# Patient Record
Sex: Female | Born: 1945
Health system: Southern US, Community
[De-identification: ages and names within clinical notes are randomized; demographics above are authoritative.]

## PROBLEM LIST (undated history)

## (undated) DIAGNOSIS — I10 Essential (primary) hypertension: Secondary | ICD-10-CM

## (undated) DIAGNOSIS — C4491 Basal cell carcinoma of skin, unspecified: Secondary | ICD-10-CM

## (undated) DIAGNOSIS — D039 Melanoma in situ, unspecified: Secondary | ICD-10-CM

## (undated) HISTORY — PX: BLADDER SURGERY: SHX569

## (undated) HISTORY — PX: ABDOMINAL HYSTERECTOMY: SHX81

## (undated) HISTORY — PX: CHOLECYSTECTOMY: SHX55

## (undated) HISTORY — DX: Essential (primary) hypertension: I10

## (undated) HISTORY — PX: TUBAL LIGATION: SHX77

---
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
## 1999-03-26 ENCOUNTER — Other Ambulatory Visit: Admission: RE | Admit: 1999-03-26 | Discharge: 1999-03-26 | Payer: Self-pay | Admitting: Obstetrics and Gynecology

## 2000-04-23 ENCOUNTER — Other Ambulatory Visit: Admission: RE | Admit: 2000-04-23 | Discharge: 2000-04-23 | Payer: Self-pay | Admitting: Obstetrics and Gynecology

## 2001-07-19 ENCOUNTER — Other Ambulatory Visit: Admission: RE | Admit: 2001-07-19 | Discharge: 2001-07-19 | Payer: Self-pay | Admitting: Obstetrics and Gynecology

## 2001-08-11 ENCOUNTER — Encounter: Payer: Self-pay | Admitting: Obstetrics and Gynecology

## 2001-08-11 ENCOUNTER — Encounter: Admission: RE | Admit: 2001-08-11 | Discharge: 2001-08-11 | Payer: Self-pay | Admitting: Obstetrics and Gynecology

## 2002-08-16 ENCOUNTER — Other Ambulatory Visit: Admission: RE | Admit: 2002-08-16 | Discharge: 2002-08-16 | Payer: Self-pay | Admitting: Gynecology

## 2003-12-19 ENCOUNTER — Encounter: Admission: RE | Admit: 2003-12-19 | Discharge: 2003-12-19 | Payer: Self-pay | Admitting: Family Medicine

## 2004-04-21 ENCOUNTER — Encounter (INDEPENDENT_AMBULATORY_CARE_PROVIDER_SITE_OTHER): Payer: Self-pay | Admitting: *Deleted

## 2004-04-21 ENCOUNTER — Ambulatory Visit (HOSPITAL_COMMUNITY): Admission: RE | Admit: 2004-04-21 | Discharge: 2004-04-22 | Payer: Self-pay | Admitting: General Surgery

## 2006-09-27 ENCOUNTER — Encounter: Admission: RE | Admit: 2006-09-27 | Discharge: 2006-09-27 | Payer: Self-pay | Admitting: Family Medicine

## 2009-06-07 ENCOUNTER — Encounter: Admission: RE | Admit: 2009-06-07 | Discharge: 2009-06-07 | Payer: Self-pay | Admitting: Infectious Diseases

## 2010-05-26 ENCOUNTER — Ambulatory Visit (HOSPITAL_BASED_OUTPATIENT_CLINIC_OR_DEPARTMENT_OTHER): Admission: RE | Admit: 2010-05-26 | Discharge: 2010-05-27 | Payer: Self-pay | Admitting: Gynecology

## 2011-02-01 LAB — POCT I-STAT 4, (NA,K, GLUC, HGB,HCT)
Glucose, Bld: 112 mg/dL — ABNORMAL HIGH (ref 70–99)
HCT: 46 % (ref 36.0–46.0)
Sodium: 142 mEq/L (ref 135–145)

## 2011-04-03 NOTE — Op Note (Signed)
NAME:  Pam Russell, Pam Russell                      ACCOUNT NO.:  [id number]   MEDICAL RECORD NO.:  [id number]                   PATIENT TYPE:  OIB   LOCATION:  NA                                   FACILITY:  [facility]   PHYSICIAN:  Jimmye Norman III, M.D.               DATE OF BIRTH:  1945/11/26   DATE OF PROCEDURE:  04/21/2004  DATE OF DISCHARGE:                                 OPERATIVE REPORT   PREOPERATIVE DIAGNOSES:  Symptomatic cholelithiasis.   POSTOPERATIVE DIAGNOSES:  Symptomatic cholelithiasis.   OPERATION PERFORMED:  Laparoscopic cholecystectomy with intraoperative  cholangiogram.   SURGEON:  Pam Russell, M.D.   ASSISTANT:  Magnus Ivan, RN   ANESTHESIA:  General endotracheal.   ESTIMATED BLOOD LOSS:  20 to 30 mL.   COMPLICATIONS:  None.   CONDITION:  Stable.   INDICATIONS FOR PROCEDURE:  The patient is a 65 year old female with  gallstones and symptoms related to that and now comes in for elective  laparoscopic cholecystectomy.   OPERATIVE FINDINGS:  The patient had omental adhesions to the periumbilical  area which needed to be avoided for the surgery.  Also, the patient had  omental adhesions to the right upper quadrant.  She also had an intrahepatic  gallbladder in a sulcus which had to be maneuvered around in order to remove  the gallbladder.   DESCRIPTION OF PROCEDURE:  The patient was taken to the operating room and  placed on the table in the supine position.  After an adequate endotracheal  anesthetic was administered, the patient was prepped and draped in the usual  sterile manner exposing the midline and the right upper quadrant.  A  supraumbilical curvilinear incision was made using a #11 blade and taken  down to the midline fascia.  Through this midline fascia, a Veress needle  was passed into the peritoneal cavity and confirmed to be in position using  a saline test.  Once this was done, carbon dioxide gas was insufflated  through this Veress needle  into the peritoneal cavity up to a maximum intra-  abdominal pressure of 15 mmHg.  Once this was done, we used an Opti-View  cannula.  11-12 mm cannula passed through the supraumbilical fascia into the  peritoneal cavity.  Once we were in the cavity, we were able to visualize  the intraperitoneal contents, however, realized that there were lots of  adhesions around the supraumbilical site.  A second cannula 11 mm type was  passed through the subxiphoid process down into the peritoneal cavity under  direct vision.  With this in place, we were able to pass the laparoscope  with attached camera and light source through the subxiphoid cannula and  view the periumbilical area where there were some omental adhesions but no  bowel injuries were noted.  We subsequently dissected out the adhesions from  the right upper quadrant using the EndoShear scissors and electrocautery and  then passed  the 5 mm cannulas into the peritoneal cavity under direct  vision.  Once this was done, the patient was placed in steep reverse  Trendelenburg position, the left side was tilted down and dissection begun.  Lots of the adhesions were not attached to the gallbladder itself.  It was  retracted towards the anterior abdominal wall and right upper quadrant and a  second grasper was placed on the infundibulum of the gallbladder.  We  dissected out the peritoneum overlying the hepatoduodenal triangle and the  triangle of Calot.  This helped [place] expose the cystic duct and a very small  remnant of the cystic artery.  However, no distinct large artery was noted.  Once the cystic duct was identified, we passed a Cook catheter through the  anterior abdominal wall and passed it into a cholecystodochotomy made using  Endo scissors.  Once this was done, we clipped it in place, did a  cholangiogram which showed a good flow to the duodenum, good proximal  filling, no evidence of obstruction, no foreign bodies.  We subsequently   placed the patient back into the dissecting position, removed his clip and  the catheter and then endoclipped the distal cystic duct with three  Endoclips.  We then transected the cystic duct, then dissected out the  gallbladder from its bed with minimal difficulty using electrocautery.  We  used an Endocatch bag to remove the gallbladder from the supraumbilical  site.  We then used one and a half to two bags or liters of saline in order  to irrigate out the right upper quadrant.  Electrocautery was used to obtain  hemostasis in the hepatic bed.  All sponge, needle and instrument counts  were correct.  We aspirated most of the gas and the fluid from the abdominal  cavity, then removed the cannulas.  The supraumbilical site was closed using  a figure-of-eight stitch of 0 Vicryl passed on a UR6 needle.  0.25% Marcaine  with epinephrine was injected at all sites.  The skin was closed using a  running subcuticular stitch of 4-0 Vicryl.  All sponge, needle and  instrument counts were correct.  The patient was then transferred to the  recovery room in stable condition.                                               Kathrin Ruddy, M.D.    JW/MEDQ  D:  04/21/2004  T:  04/21/2004  Job:  [id number]

## 2011-10-13 ENCOUNTER — Other Ambulatory Visit: Payer: Self-pay | Admitting: Gynecology

## 2012-04-26 ENCOUNTER — Other Ambulatory Visit: Payer: Self-pay | Admitting: Family Medicine

## 2013-10-27 ENCOUNTER — Other Ambulatory Visit: Payer: Self-pay | Admitting: Physician Assistant

## 2014-10-25 ENCOUNTER — Ambulatory Visit
Admission: RE | Admit: 2014-10-25 | Discharge: 2014-10-25 | Disposition: A | Discharge status: Home / Self Care | Payer: Medicare Other | Source: Ambulatory Visit | Attending: Family Medicine | Admitting: Family Medicine

## 2014-10-25 ENCOUNTER — Other Ambulatory Visit: Payer: Self-pay | Admitting: Family Medicine

## 2014-10-25 DIAGNOSIS — R079 Chest pain, unspecified: Secondary | ICD-10-CM

## 2014-12-13 ENCOUNTER — Other Ambulatory Visit: Payer: Self-pay

## 2015-01-14 ENCOUNTER — Ambulatory Visit (INDEPENDENT_AMBULATORY_CARE_PROVIDER_SITE_OTHER): Payer: Medicare Other

## 2015-01-14 ENCOUNTER — Ambulatory Visit (INDEPENDENT_AMBULATORY_CARE_PROVIDER_SITE_OTHER): Payer: Self-pay | Admitting: Family Medicine

## 2015-01-14 VITALS — BP 118/78 | HR 75 | Temp 98.0°F | Resp 18

## 2015-01-14 DIAGNOSIS — R079 Chest pain, unspecified: Secondary | ICD-10-CM

## 2015-01-14 DIAGNOSIS — I10 Essential (primary) hypertension: Secondary | ICD-10-CM | POA: Diagnosis not present

## 2015-01-14 LAB — D-DIMER, QUANTITATIVE: D-Dimer, Quant: 0.33 ug/mL-FEU (ref 0.00–0.48)

## 2015-01-14 LAB — TROPONIN I

## 2015-01-14 MED ORDER — ASPIRIN 81 MG PO CHEW: 162.0000 mg | CHEWABLE_TABLET | Freq: Once | ORAL | Status: AC

## 2015-01-14 NOTE — Patient Instructions (Addendum)
I will give you a call if either of your stat labs are positive- in this case you will need to go to the ER You will be seen tomorrow at Dr. Irven Shelling office at 12:00  [facility] ?  Address: [address] [place], [place] [address] [phone]  Take care!

## 2015-01-14 NOTE — Progress Notes (Signed)
[facility] [address] [place] [address] [phone]  Date:  01/14/2015   Name:  Pam Russell   DOB:  November 22, 1945   MRN:  [id number]  PCP:  No primary care provider on file.    Chief Complaint: Chest Pain and Shortness of Breath   History of Present Illness:  Pam Russell is a 69 y.o. very pleasant female patient who presents with the following:  Today is Monday. Over the weekend she felt "uncomfortable in my chest, but not enough to go the the ER."  Yesterday she felt like her bra was too tight, she had pain in her left side and under the left breast.  Felt like her chest was heavy. Today she went into the yard and picked up a few fallen limbs- she felt quite out of breath after doing just a little activity.   She generally exercises without SOB; she lifts weight at "[employer]."  Normally she has good exercise tolerance.   She did have a "rapid heartbeat" years ago that was resolved "with a shot."  Her father had an MI at age 67; he lived until his 13s however.   She has a history of HTN and MVP- no other chronic cardiac issues.  She is a non- smoker, no history of DM Her PCP is Donnie Coffin.    She had a stress test about 5 years ago per Dr. Einar Gip, this looked ok per her recollection.  She did an ETT; she cannot remember just why she had this done.  She took 81mg  of asa X2 today  There are no active problems to display for this patient.   Past Medical History  Diagnosis Date  . Hypertension     Past Surgical History  Procedure Laterality Date  . Abdominal hysterectomy    . Cholecystectomy    . Bladder surgery    . Tubal ligation      History  Substance Use Topics  . Smoking status: Never Smoker   . Smokeless tobacco: Not on file  . Alcohol Use: 1.2 oz/week    2 Standard drinks or equivalent per week    No family history on file.  Allergies  Allergen Reactions  . Erythromycin   . Sulfa Antibiotics     Medication list has  been reviewed and updated.  No current outpatient prescriptions on file prior to visit.   No current facility-administered medications on file prior to visit.    Review of Systems:  As per HPI- otherwise negative.   Physical Examination: Filed Vitals:   01/14/15 1550  BP: 118/78  Pulse: 75  Temp: 98 F (36.7 C)  Resp: 18   There were no vitals filed for this visit. There is no height or weight on file to calculate BMI. Ideal Body Weight:    GEN: WDWN, NAD, Non-toxic, A & O x 3, overweight, looks well HEENT: Atraumatic, Normocephalic. Neck supple. No masses, No LAD. Bilateral TM wnl, oropharynx normal.  PEERL,EOMI.   Ears and Nose: No external deformity. CV: RRR, No M/G/R. No JVD. No thrill. No extra heart sounds. PULM: CTA B, no wheezes, crackles, rhonchi. No retractions. No resp. distress. No accessory muscle use. ABD: S, NT, ND. No rebound. No HSM.  Benign exam EXTR: No c/c/e NEURO Normal gait.  PSYCH: Normally interactive. Conversant. Not depressed or anxious appearing.  Calm demeanor.  No rash or lesion on her chest wall to indicate shingles.    EKG: no acute  changes  [facility] reading (PRIMARY) by  Dr. Lorelei Pont. negative  CLINICAL DATA: Chest discomfort starting yesterday  EXAM: CHEST 2 VIEW  COMPARISON: 10/25/2014  FINDINGS: Cardiomediastinal silhouette is stable. No acute infiltrate or pleural effusion. No pulmonary edema. Mild degenerative changes mid thoracic spine.  IMPRESSION: No active cardiopulmonary disease   Assessment and Plan: Chest pain, unspecified chest pain type - Plan: EKG 12-Lead, DG Chest 2 View, aspirin chewable tablet 162 mg, D-dimer, quantitative, Troponin I  69 year old woman without any previous cardiac diease history except HTN. Here today with vague sx of chest discomfort- worse yesterday, better today.  She states that her sx are currently resolved.  Discussed with NP at [facility] cardiology who reviewed her EKG.  Plan to have  her follow-up there tomorrow- will check a stat D dimer and troponin.  If positive labs or if any return or sx she will go to the ER.  Offered immediate ER evaluation today but she declines.   Results for orders placed or performed in visit on 01/14/15  D-dimer, quantitative  Result Value Ref Range   D-Dimer, Quant 0.33 0.00 - 0.48 ug/mL-FEU  Troponin I  Result Value Ref Range   Troponin I <0.01 <0.06 ng/mL   Received labs- normal.  We had agreed that I would only call her if abnormal. Proceed with plan to see Dr. Einar Gip tomorrow .   Signed Lamar Blinks, MD

## 2015-09-13 ENCOUNTER — Other Ambulatory Visit: Payer: Self-pay | Admitting: Family Medicine

## 2015-09-13 DIAGNOSIS — R222 Localized swelling, mass and lump, trunk: Secondary | ICD-10-CM

## 2015-09-13 DIAGNOSIS — N644 Mastodynia: Secondary | ICD-10-CM

## 2015-12-24 DIAGNOSIS — M84374A Stress fracture, right foot, initial encounter for fracture: Secondary | ICD-10-CM | POA: Diagnosis not present

## 2015-12-24 DIAGNOSIS — E559 Vitamin D deficiency, unspecified: Secondary | ICD-10-CM | POA: Diagnosis not present

## 2015-12-25 DIAGNOSIS — E559 Vitamin D deficiency, unspecified: Secondary | ICD-10-CM | POA: Diagnosis not present

## 2016-02-03 DIAGNOSIS — H43813 Vitreous degeneration, bilateral: Secondary | ICD-10-CM | POA: Diagnosis not present

## 2016-02-03 DIAGNOSIS — H2513 Age-related nuclear cataract, bilateral: Secondary | ICD-10-CM | POA: Diagnosis not present

## 2016-02-21 DIAGNOSIS — H43813 Vitreous degeneration, bilateral: Secondary | ICD-10-CM | POA: Diagnosis not present

## 2016-02-21 DIAGNOSIS — H2513 Age-related nuclear cataract, bilateral: Secondary | ICD-10-CM | POA: Diagnosis not present

## 2016-03-05 DIAGNOSIS — H2512 Age-related nuclear cataract, left eye: Secondary | ICD-10-CM | POA: Diagnosis not present

## 2016-03-06 DIAGNOSIS — H2512 Age-related nuclear cataract, left eye: Secondary | ICD-10-CM | POA: Diagnosis not present

## 2016-03-16 DIAGNOSIS — H2511 Age-related nuclear cataract, right eye: Secondary | ICD-10-CM | POA: Diagnosis not present

## 2016-03-19 DIAGNOSIS — H2511 Age-related nuclear cataract, right eye: Secondary | ICD-10-CM | POA: Diagnosis not present

## 2016-04-15 DIAGNOSIS — M8588 Other specified disorders of bone density and structure, other site: Secondary | ICD-10-CM | POA: Diagnosis not present

## 2016-04-15 DIAGNOSIS — Z780 Asymptomatic menopausal state: Secondary | ICD-10-CM | POA: Diagnosis not present

## 2016-05-25 DIAGNOSIS — F419 Anxiety disorder, unspecified: Secondary | ICD-10-CM | POA: Diagnosis not present

## 2016-05-25 DIAGNOSIS — E049 Nontoxic goiter, unspecified: Secondary | ICD-10-CM | POA: Diagnosis not present

## 2016-05-25 DIAGNOSIS — E7800 Pure hypercholesterolemia, unspecified: Secondary | ICD-10-CM | POA: Diagnosis not present

## 2016-05-25 DIAGNOSIS — R7309 Other abnormal glucose: Secondary | ICD-10-CM | POA: Diagnosis not present

## 2016-05-25 DIAGNOSIS — I10 Essential (primary) hypertension: Secondary | ICD-10-CM | POA: Diagnosis not present

## 2016-05-25 DIAGNOSIS — Z1211 Encounter for screening for malignant neoplasm of colon: Secondary | ICD-10-CM | POA: Diagnosis not present

## 2016-08-13 DIAGNOSIS — K648 Other hemorrhoids: Secondary | ICD-10-CM | POA: Diagnosis not present

## 2016-08-13 DIAGNOSIS — Z1211 Encounter for screening for malignant neoplasm of colon: Secondary | ICD-10-CM | POA: Diagnosis not present

## 2016-12-14 DIAGNOSIS — Z1231 Encounter for screening mammogram for malignant neoplasm of breast: Secondary | ICD-10-CM | POA: Diagnosis not present

## 2016-12-16 DIAGNOSIS — I10 Essential (primary) hypertension: Secondary | ICD-10-CM | POA: Diagnosis not present

## 2016-12-16 DIAGNOSIS — E049 Nontoxic goiter, unspecified: Secondary | ICD-10-CM | POA: Diagnosis not present

## 2016-12-16 DIAGNOSIS — F419 Anxiety disorder, unspecified: Secondary | ICD-10-CM | POA: Diagnosis not present

## 2016-12-16 DIAGNOSIS — Z23 Encounter for immunization: Secondary | ICD-10-CM | POA: Diagnosis not present

## 2016-12-16 DIAGNOSIS — R7303 Prediabetes: Secondary | ICD-10-CM | POA: Diagnosis not present

## 2017-01-16 DIAGNOSIS — J101 Influenza due to other identified influenza virus with other respiratory manifestations: Secondary | ICD-10-CM | POA: Diagnosis not present

## 2017-01-16 DIAGNOSIS — R05 Cough: Secondary | ICD-10-CM | POA: Diagnosis not present

## 2017-02-05 DIAGNOSIS — L723 Sebaceous cyst: Secondary | ICD-10-CM | POA: Diagnosis not present

## 2017-02-05 DIAGNOSIS — L089 Local infection of the skin and subcutaneous tissue, unspecified: Secondary | ICD-10-CM | POA: Diagnosis not present

## 2017-02-11 DIAGNOSIS — L814 Other melanin hyperpigmentation: Secondary | ICD-10-CM | POA: Diagnosis not present

## 2017-02-11 DIAGNOSIS — L723 Sebaceous cyst: Secondary | ICD-10-CM | POA: Diagnosis not present

## 2017-03-10 DIAGNOSIS — R938 Abnormal findings on diagnostic imaging of other specified body structures: Secondary | ICD-10-CM | POA: Diagnosis not present

## 2017-03-23 DIAGNOSIS — M778 Other enthesopathies, not elsewhere classified: Secondary | ICD-10-CM | POA: Diagnosis not present

## 2017-04-15 ENCOUNTER — Other Ambulatory Visit: Payer: Self-pay

## 2017-04-15 DIAGNOSIS — D492 Neoplasm of unspecified behavior of bone, soft tissue, and skin: Secondary | ICD-10-CM | POA: Diagnosis not present

## 2017-04-15 DIAGNOSIS — L814 Other melanin hyperpigmentation: Secondary | ICD-10-CM | POA: Diagnosis not present

## 2017-04-15 DIAGNOSIS — R233 Spontaneous ecchymoses: Secondary | ICD-10-CM | POA: Diagnosis not present

## 2017-04-15 DIAGNOSIS — L820 Inflamed seborrheic keratosis: Secondary | ICD-10-CM | POA: Diagnosis not present

## 2017-05-09 ENCOUNTER — Encounter (HOSPITAL_COMMUNITY): Payer: Self-pay | Admitting: Emergency Medicine

## 2017-05-09 ENCOUNTER — Emergency Department (HOSPITAL_COMMUNITY)
Admission: EM | Admit: 2017-05-09 | Discharge: 2017-05-09 | Disposition: A | Discharge status: Home / Self Care | Payer: Medicare Other | Attending: Emergency Medicine | Admitting: Emergency Medicine

## 2017-05-09 ENCOUNTER — Emergency Department (HOSPITAL_COMMUNITY): Payer: Medicare Other

## 2017-05-09 DIAGNOSIS — Z79899 Other long term (current) drug therapy: Secondary | ICD-10-CM | POA: Insufficient documentation

## 2017-05-09 DIAGNOSIS — Z7982 Long term (current) use of aspirin: Secondary | ICD-10-CM | POA: Diagnosis not present

## 2017-05-09 DIAGNOSIS — R42 Dizziness and giddiness: Secondary | ICD-10-CM | POA: Diagnosis not present

## 2017-05-09 DIAGNOSIS — H81399 Other peripheral vertigo, unspecified ear: Secondary | ICD-10-CM | POA: Diagnosis not present

## 2017-05-09 DIAGNOSIS — I10 Essential (primary) hypertension: Secondary | ICD-10-CM | POA: Insufficient documentation

## 2017-05-09 LAB — COMPREHENSIVE METABOLIC PANEL
ALT: 46 U/L (ref 14–54)
AST: 38 U/L (ref 15–41)
Albumin: 4.3 g/dL (ref 3.5–5.0)
Alkaline Phosphatase: 44 U/L (ref 38–126)
Anion gap: 5 (ref 5–15)
BILIRUBIN TOTAL: 0.8 mg/dL (ref 0.3–1.2)
BUN: 17 mg/dL (ref 6–20)
CALCIUM: 9.1 mg/dL (ref 8.9–10.3)
CHLORIDE: 106 mmol/L (ref 101–111)
CO2: 28 mmol/L (ref 22–32)
CREATININE: 0.7 mg/dL (ref 0.44–1.00)
Glucose, Bld: 120 mg/dL — ABNORMAL HIGH (ref 65–99)
Potassium: 3.9 mmol/L (ref 3.5–5.1)
Sodium: 139 mmol/L (ref 135–145)
TOTAL PROTEIN: 7.5 g/dL (ref 6.5–8.1)

## 2017-05-09 LAB — URINALYSIS, ROUTINE W REFLEX MICROSCOPIC
Bilirubin Urine: NEGATIVE
Glucose, UA: NEGATIVE mg/dL
Hgb urine dipstick: NEGATIVE
KETONES UR: 5 mg/dL — AB
LEUKOCYTES UA: NEGATIVE
NITRITE: NEGATIVE
PROTEIN: NEGATIVE mg/dL
Specific Gravity, Urine: 1.017 (ref 1.005–1.030)
pH: 7 (ref 5.0–8.0)

## 2017-05-09 LAB — LIPASE, BLOOD: LIPASE: 24 U/L (ref 11–51)

## 2017-05-09 LAB — CBC
HCT: 42.7 % (ref 36.0–46.0)
Hemoglobin: 14 g/dL (ref 12.0–15.0)
MCH: 26 pg (ref 26.0–34.0)
MCHC: 32.8 g/dL (ref 30.0–36.0)
MCV: 79.4 fL (ref 78.0–100.0)
PLATELETS: 237 10*3/uL (ref 150–400)
RBC: 5.38 MIL/uL — AB (ref 3.87–5.11)
RDW: 14 % (ref 11.5–15.5)
WBC: 5.8 10*3/uL (ref 4.0–10.5)

## 2017-05-09 MED ORDER — ONDANSETRON 4 MG PO TBDP
4.0000 mg | ORAL_TABLET | Freq: Once | ORAL | Status: AC
  Administered 2017-05-09: 4 mg via ORAL
  Filled 2017-05-09: qty 1

## 2017-05-09 MED ORDER — MECLIZINE HCL 25 MG PO TABS
25.0000 mg | ORAL_TABLET | Freq: Once | ORAL | Status: AC
  Administered 2017-05-09: 25 mg via ORAL
  Filled 2017-05-09: qty 1

## 2017-05-09 MED ORDER — SODIUM CHLORIDE 0.9 % IV BOLUS (SEPSIS)
1000.0000 mL | Freq: Once | INTRAVENOUS | Status: AC
  Administered 2017-05-09: 1000 mL via INTRAVENOUS

## 2017-05-09 MED ORDER — MECLIZINE HCL 12.5 MG PO TABS: 12.5000 mg | ORAL_TABLET | Freq: Three times a day (TID) | ORAL | 0 refills | Status: AC | PRN

## 2017-05-09 MED ORDER — ONDANSETRON 4 MG PO TBDP: 4.0000 mg | ORAL_TABLET | Freq: Three times a day (TID) | ORAL | 0 refills | Status: AC | PRN

## 2017-05-09 MED ORDER — ONDANSETRON HCL 4 MG/2ML IJ SOLN: 4.0000 mg | INTRAMUSCULAR | Status: DC | PRN

## 2017-05-09 NOTE — ED Provider Notes (Signed)
[facility] DEPT Provider Note   CSN: [id number] Arrival date & time: 05/09/17  1233     History   Chief Complaint Chief Complaint  Patient presents with  . Dizziness  . Emesis    HPI Pam Russell is a 71 y.o. female.  Patient with past medical history hypertension and mitral valve prolapse, presents with acute onset of intermittent dizziness that began this morning upon getting out of bed. Patient states upon standing she felt immediately dizzy and off-balance. This dizziness was persistently provoked with standing throughout the morning, and has associated nausea and vomiting. She also states she feels "swimmy headed." She denies vision changes, chest pain, shortness of breath, abdominal pain, headache, slurred speech, unilateral weakness, urinary sx, diarrhea, cough. No past medical history of stroke or DVT. She states she has not taken her blood pressure medication this morning.      Past Medical History:  Diagnosis Date  . Hypertension     Patient Active Problem List   Diagnosis Date Noted  . Benign essential HTN 01/14/2015    Past Surgical History:  Procedure Laterality Date  . ABDOMINAL HYSTERECTOMY    . BLADDER SURGERY    . CHOLECYSTECTOMY    . TUBAL LIGATION      OB History    No data available       Home Medications    Prior to Admission medications   Medication Sig Start Date End Date Taking? Authorizing Provider  aspirin 81 MG tablet Take 162 mg by mouth daily.    Yes [provider]  cholecalciferol (VITAMIN D) 1000 UNITS tablet Take 2,000 Units by mouth daily.    Yes [provider]  lisinopril (PRINIVIL,ZESTRIL) 20 MG tablet Take 20 mg by mouth daily. 03/09/17  Yes [provider]  magnesium 30 MG tablet Take 30 mg by mouth 2 (two) times daily.   Yes [provider]  MAGNESIUM PO Take 1 capsule by mouth daily. Unable to remember exact strength   Yes [provider]  metoprolol succinate  (TOPROL-XL) 50 MG 24 hr tablet Take 50 mg by mouth daily. Take with or immediately following a meal.   Yes [provider]  meclizine (ANTIVERT) 12.5 MG tablet Take 1 tablet (12.5 mg total) by mouth 3 (three) times daily as needed for dizziness. 05/09/17   Russo, [place] N, PA-C  ondansetron (ZOFRAN ODT) 4 MG disintegrating tablet Take 1 tablet (4 mg total) by mouth every 8 (eight) hours as needed for nausea or vomiting. 05/09/17   Russo, [place] N, PA-C    Family History No family history on file.  Social History Social History  Substance Use Topics  . Smoking status: Never Smoker  . Smokeless tobacco: Not on file  . Alcohol use 1.2 oz/week    2 Standard drinks or equivalent per week     Allergies   Erythromycin and Sulfa antibiotics   Review of Systems Review of Systems  Constitutional: Negative for fever.  HENT: Negative for congestion, sore throat and trouble swallowing.   Eyes: Negative for visual disturbance.  Respiratory: Negative for cough and shortness of breath.   Cardiovascular: Negative for chest pain.  Gastrointestinal: Positive for nausea and vomiting. Negative for abdominal pain and diarrhea.  Genitourinary: Negative for dysuria and frequency.  Musculoskeletal: Negative for gait problem.  Neurological: Positive for dizziness. Negative for syncope, facial asymmetry, speech difficulty, weakness and headaches.  Psychiatric/Behavioral: Negative for confusion.     Physical Exam Updated Vital Signs BP 139/79 (  BP Location: Left Arm)   Pulse 82   Temp 98.4 F (36.9 C) (Oral)   Resp 18   Wt 74.4 kg (164 lb)   SpO2 99%   Physical Exam  Constitutional: She is oriented to person, place, and time. She appears well-developed and well-nourished. No distress.  Pt is well appearing, speaking clearly, no facial droop  HENT:  Head: Normocephalic and atraumatic.  Mouth/Throat: Oropharynx is clear and moist.  Eyes: Conjunctivae and EOM are normal. Pupils are equal,  round, and reactive to light.  Neck: Normal range of motion. Neck supple.  Cardiovascular: Normal rate, regular rhythm and intact distal pulses.   Pulmonary/Chest: Effort normal and breath sounds normal. No respiratory distress. She has no wheezes. She has no rales.  Abdominal: Soft. Bowel sounds are normal. She exhibits no distension and no mass. There is no tenderness. There is no rebound and no guarding.  Musculoskeletal: Normal range of motion.  Moving all extremities  Neurological: She is alert and oriented to person, place, and time. She displays normal reflexes. No cranial nerve deficit or sensory deficit. She exhibits normal muscle tone. Coordination normal.  5/5 strength BUE and BLE. Normal heel to shin, normal finger to nose. Pt off-balance upon standing, however this subsided and pt able to ambulate without assistance.  Skin: Skin is warm.  Psychiatric: She has a normal mood and affect. Her behavior is normal.  Nursing note and vitals reviewed.    ED Treatments / Results  Labs (all labs ordered are listed, but only abnormal results are displayed) Labs Reviewed  COMPREHENSIVE METABOLIC PANEL - Abnormal; Notable for the following:       Result Value   Glucose, Bld 120 (*)    All other components within normal limits  CBC - Abnormal; Notable for the following:    RBC 5.38 (*)    All other components within normal limits  URINALYSIS, ROUTINE W REFLEX MICROSCOPIC - Abnormal; Notable for the following:    Ketones, ur 5 (*)    All other components within normal limits  LIPASE, BLOOD    EKG  EKG Interpretation  Date/Time:  Sunday May 09 2017 15:01:46 EDT Ventricular Rate:  64 PR Interval:    QRS Duration: 131 QT Interval:  437 QTC Calculation: 451 R Axis:   19 Text Interpretation:  Sinus rhythm Nonspecific intraventricular conduction delay No significant change since last tracing Confirmed by Wandra Arthurs [id number]) on 05/09/2017 3:10:01 PM Also confirmed by Wandra Arthurs  [phone]), editor Laurena Spies [phone])  on 05/09/2017 3:10:50 PM       Radiology Mr Brain Wo Contrast  Result Date: 05/09/2017 CLINICAL DATA:  Dizziness, evaluate for posterior circulation infarction. Symptoms began earlier today. EXAM: MRI HEAD WITHOUT CONTRAST TECHNIQUE: Multiplanar, multiecho pulse sequences of the brain and surrounding structures were obtained without intravenous contrast. COMPARISON:  None. FINDINGS: Brain: No evidence for acute infarction, hemorrhage, mass lesion, hydrocephalus, or extra-axial fluid. Normal for age cerebral volume. Mild subcortical and periventricular T2 and FLAIR hyperintensities, likely chronic microvascular ischemic change. Vascular: Flow voids are maintained throughout the carotid, basilar, and vertebral arteries. LEFT vertebral is dominant. There are no areas of chronic hemorrhage. Skull and upper cervical spine: Unremarkable visualized calvarium, skullbase, and cervical vertebrae. Pituitary, pineal, cerebellar tonsils unremarkable. No upper cervical cord lesions. Sinuses/Orbits: No orbital masses or proptosis. Globes appear symmetric. Sinuses appear well aerated, without evidence for air-fluid level. BILATERAL cataract extraction. Other: No nasopharyngeal pathology or mastoid fluid. Scalp and other visualized extracranial  soft tissues grossly unremarkable. IMPRESSION: No evidence of acute or chronic brainstem or cerebellar infarct. Flow voids are maintained, with a robust and dominant LEFT vertebral supplying the basilar. Mild chronic microvascular ischemic changes, predominantly supratentorial. No middle ear/mastoid or paranasal sinus inflammatory process is evident. Electronically Signed   By: Staci Righter M.D.   On: 05/09/2017 16:35    Procedures Procedures (including critical care time)  Medications Ordered in ED Medications  ondansetron (ZOFRAN) injection 4 mg (not administered)  sodium chloride 0.9 % bolus 1,000 mL (1,000 mLs Intravenous New  Bag/Given 05/09/17 1458)  meclizine (ANTIVERT) tablet 25 mg (25 mg Oral Given 05/09/17 1445)  ondansetron (ZOFRAN-ODT) disintegrating tablet 4 mg (4 mg Oral Given 05/09/17 1445)     Initial Impression / Assessment and Plan / ED Course  I have reviewed the triage vital signs and the nursing notes.  Pertinent labs & imaging results that were available during my care of the patient were reviewed by me and considered in my medical decision making (see chart for details).     Patient with symptoms consistent with peripheral vertigo. Normal neurologic exam.  No vertical or rotational nystagmus. Patient normal finger-nose and normal gait.  No slurred speech renal or weakness.  Normal EKG, normal orthostatics, labs reassuring. MRI head negative for CVA or central cause of vertigo.  Will discharge home with meclizine and zofran. Patient instructed to followup with her primary care physician and ENT for further evaluation. They are to return to the emergency department for new neurologic symptoms, loss of vision or other concerning symptoms. Pt is well-appearing, safe for discharge home.  Patient discussed with and seen by Dr. Darl Householder.  Discussed results, findings, treatment and follow up. Patient advised of return precautions. Patient verbalized understanding and agreed with plan.  Final Clinical Impressions(s) / ED Diagnoses   Final diagnoses:  Peripheral vertigo, unspecified laterality    New Prescriptions New Prescriptions   MECLIZINE (ANTIVERT) 12.5 MG TABLET    Take 1 tablet (12.5 mg total) by mouth 3 (three) times daily as needed for dizziness.   ONDANSETRON (ZOFRAN ODT) 4 MG DISINTEGRATING TABLET    Take 1 tablet (4 mg total) by mouth every 8 (eight) hours as needed for nausea or vomiting.     Russo, [place] N, PA-C 05/09/17 1713    Russo, [place] N, PA-C 05/09/17 1715    Drenda Freeze, MD 05/10/17 [phone]

## 2017-05-09 NOTE — ED Notes (Signed)
Pt transported to MRI 

## 2017-05-09 NOTE — Discharge Instructions (Signed)
Please read instructions below. You can take Meclizine every 8 hours as needed for dizziness. You can take zofran every 8 hours as needed for nausea. Drink plenty of water. Schedule an appointment with the ENT specialist for follow up. Talk with your primary care provider about your new medications. Return to the ER for weakness, slurred speech, severe headache, or new or concerning symptoms.

## 2017-05-09 NOTE — ED Triage Notes (Signed)
Pt from home with c/o dizziness that began this morning. Pt states she thinks her symptoms are due to vertigo. Pt states she feels "off balance and swimmy headed" pt states it does not feel as if the room is spinning in any particular direction. Pt states she has also had 3 episodes of emesis at this time. Pt denies pain. Pt denies diarrhea or urinary symptoms.

## 2017-06-16 DIAGNOSIS — I10 Essential (primary) hypertension: Secondary | ICD-10-CM | POA: Diagnosis not present

## 2017-06-16 DIAGNOSIS — F419 Anxiety disorder, unspecified: Secondary | ICD-10-CM | POA: Diagnosis not present

## 2017-06-16 DIAGNOSIS — E049 Nontoxic goiter, unspecified: Secondary | ICD-10-CM | POA: Diagnosis not present

## 2017-06-16 DIAGNOSIS — R7303 Prediabetes: Secondary | ICD-10-CM | POA: Diagnosis not present

## 2017-12-17 DIAGNOSIS — F419 Anxiety disorder, unspecified: Secondary | ICD-10-CM | POA: Diagnosis not present

## 2017-12-17 DIAGNOSIS — I10 Essential (primary) hypertension: Secondary | ICD-10-CM | POA: Diagnosis not present

## 2017-12-17 DIAGNOSIS — R7303 Prediabetes: Secondary | ICD-10-CM | POA: Diagnosis not present

## 2017-12-17 DIAGNOSIS — E049 Nontoxic goiter, unspecified: Secondary | ICD-10-CM | POA: Diagnosis not present

## 2018-01-26 DIAGNOSIS — Z1231 Encounter for screening mammogram for malignant neoplasm of breast: Secondary | ICD-10-CM | POA: Diagnosis not present

## 2018-02-03 DIAGNOSIS — L728 Other follicular cysts of the skin and subcutaneous tissue: Secondary | ICD-10-CM | POA: Diagnosis not present

## 2018-02-03 DIAGNOSIS — L723 Sebaceous cyst: Secondary | ICD-10-CM | POA: Diagnosis not present

## 2018-03-29 DIAGNOSIS — L570 Actinic keratosis: Secondary | ICD-10-CM | POA: Diagnosis not present

## 2018-04-21 DIAGNOSIS — J8489 Other specified interstitial pulmonary diseases: Secondary | ICD-10-CM | POA: Diagnosis not present

## 2018-05-06 ENCOUNTER — Other Ambulatory Visit: Payer: Self-pay

## 2018-05-06 DIAGNOSIS — D039 Melanoma in situ, unspecified: Secondary | ICD-10-CM

## 2018-05-06 DIAGNOSIS — L409 Psoriasis, unspecified: Secondary | ICD-10-CM | POA: Diagnosis not present

## 2018-05-06 DIAGNOSIS — D0361 Melanoma in situ of right upper limb, including shoulder: Secondary | ICD-10-CM | POA: Diagnosis not present

## 2018-05-06 DIAGNOSIS — L821 Other seborrheic keratosis: Secondary | ICD-10-CM | POA: Diagnosis not present

## 2018-05-06 HISTORY — DX: Melanoma in situ, unspecified: D03.9

## 2018-05-31 DIAGNOSIS — L570 Actinic keratosis: Secondary | ICD-10-CM | POA: Diagnosis not present

## 2018-06-03 ENCOUNTER — Other Ambulatory Visit: Payer: Self-pay | Admitting: Dermatology

## 2018-06-03 DIAGNOSIS — L988 Other specified disorders of the skin and subcutaneous tissue: Secondary | ICD-10-CM | POA: Diagnosis not present

## 2018-06-03 DIAGNOSIS — C4361 Malignant melanoma of right upper limb, including shoulder: Secondary | ICD-10-CM | POA: Diagnosis not present

## 2018-06-09 DIAGNOSIS — L089 Local infection of the skin and subcutaneous tissue, unspecified: Secondary | ICD-10-CM | POA: Diagnosis not present

## 2018-06-27 DIAGNOSIS — E049 Nontoxic goiter, unspecified: Secondary | ICD-10-CM | POA: Diagnosis not present

## 2018-06-27 DIAGNOSIS — I10 Essential (primary) hypertension: Secondary | ICD-10-CM | POA: Diagnosis not present

## 2018-06-27 DIAGNOSIS — Z1159 Encounter for screening for other viral diseases: Secondary | ICD-10-CM | POA: Diagnosis not present

## 2018-06-27 DIAGNOSIS — F419 Anxiety disorder, unspecified: Secondary | ICD-10-CM | POA: Diagnosis not present

## 2018-06-27 DIAGNOSIS — R7303 Prediabetes: Secondary | ICD-10-CM | POA: Diagnosis not present

## 2018-07-12 DIAGNOSIS — L570 Actinic keratosis: Secondary | ICD-10-CM | POA: Diagnosis not present

## 2018-07-22 DIAGNOSIS — R05 Cough: Secondary | ICD-10-CM | POA: Diagnosis not present

## 2018-07-29 DIAGNOSIS — J069 Acute upper respiratory infection, unspecified: Secondary | ICD-10-CM | POA: Diagnosis not present

## 2018-08-23 DIAGNOSIS — L570 Actinic keratosis: Secondary | ICD-10-CM | POA: Diagnosis not present

## 2018-09-07 ENCOUNTER — Other Ambulatory Visit: Payer: Self-pay | Admitting: Physician Assistant

## 2018-09-07 DIAGNOSIS — C44612 Basal cell carcinoma of skin of right upper limb, including shoulder: Secondary | ICD-10-CM | POA: Diagnosis not present

## 2018-09-07 DIAGNOSIS — C4491 Basal cell carcinoma of skin, unspecified: Secondary | ICD-10-CM

## 2018-09-07 DIAGNOSIS — D229 Melanocytic nevi, unspecified: Secondary | ICD-10-CM | POA: Diagnosis not present

## 2018-09-07 DIAGNOSIS — L820 Inflamed seborrheic keratosis: Secondary | ICD-10-CM | POA: Diagnosis not present

## 2018-09-07 HISTORY — DX: Basal cell carcinoma of skin, unspecified: C44.91

## 2018-10-06 DIAGNOSIS — C44612 Basal cell carcinoma of skin of right upper limb, including shoulder: Secondary | ICD-10-CM | POA: Diagnosis not present

## 2018-10-11 DIAGNOSIS — Z23 Encounter for immunization: Secondary | ICD-10-CM | POA: Diagnosis not present

## 2018-10-11 DIAGNOSIS — M791 Myalgia, unspecified site: Secondary | ICD-10-CM | POA: Diagnosis not present

## 2019-01-04 DIAGNOSIS — L409 Psoriasis, unspecified: Secondary | ICD-10-CM | POA: Diagnosis not present

## 2019-01-04 DIAGNOSIS — R7303 Prediabetes: Secondary | ICD-10-CM | POA: Diagnosis not present

## 2019-01-04 DIAGNOSIS — E049 Nontoxic goiter, unspecified: Secondary | ICD-10-CM | POA: Diagnosis not present

## 2019-01-04 DIAGNOSIS — F419 Anxiety disorder, unspecified: Secondary | ICD-10-CM | POA: Diagnosis not present

## 2019-01-04 DIAGNOSIS — Z85820 Personal history of malignant melanoma of skin: Secondary | ICD-10-CM | POA: Diagnosis not present

## 2019-01-04 DIAGNOSIS — D229 Melanocytic nevi, unspecified: Secondary | ICD-10-CM | POA: Diagnosis not present

## 2019-01-04 DIAGNOSIS — I10 Essential (primary) hypertension: Secondary | ICD-10-CM | POA: Diagnosis not present

## 2019-03-10 IMAGING — MR MR HEAD W/O CM
8 of 10 series · 36 of 48 positions shown · non-contrast
Comparison: None.

CLINICAL DATA: Dizziness, evaluate for posterior circulation
infarction. Symptoms began earlier today.

EXAM:
MRI HEAD WITHOUT CONTRAST
TECHNIQUE: Multiplanar, multiecho pulse sequences of the brain and surrounding
structures were obtained without intravenous contrast.

[Series 3: T1 · sagittal · 5.0mm · 0.47mm/px · 2 of 24 slices shown]
[im 1/24]
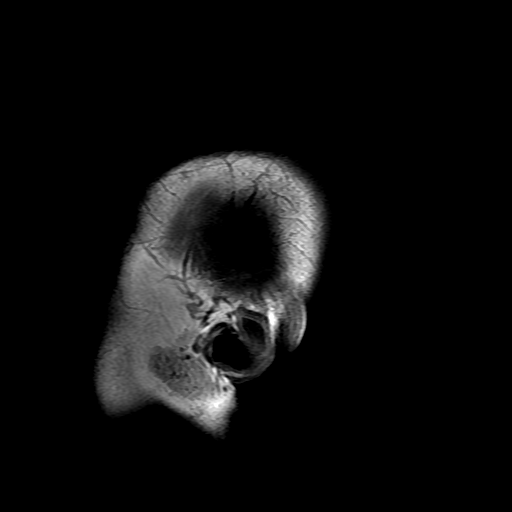
[im 8/24]
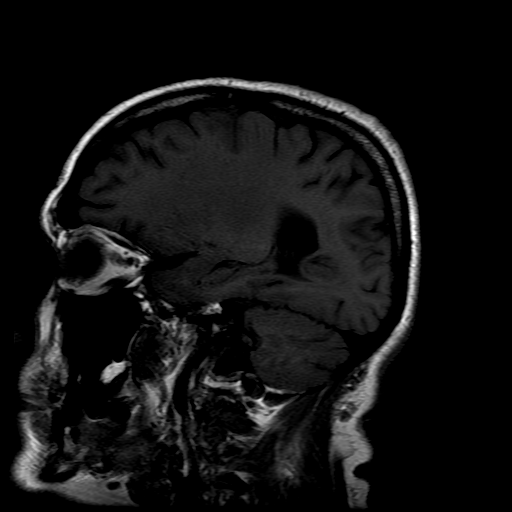

[Series 4: DWI · axial · 4.0mm · 1.17mm/px · z∈[-74,+73]mm · 8 of 68 slices shown (1 of 4)]
[im 1/68]
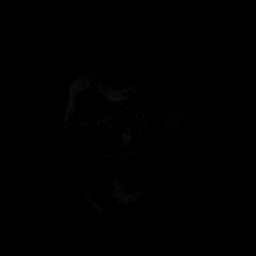
[im 10/68]
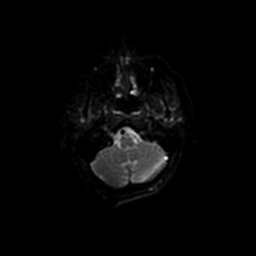
[im 20/68]
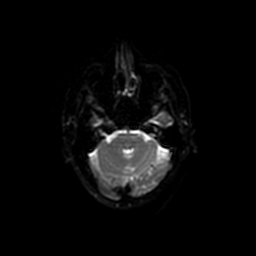
[im 29/68]
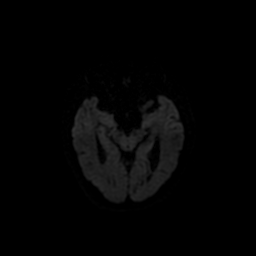
[im 39/68]
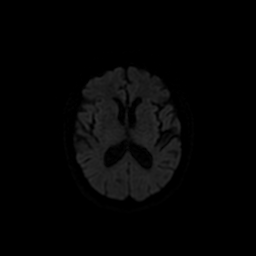
[im 48/68]
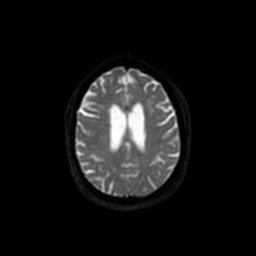
[im 58/68]
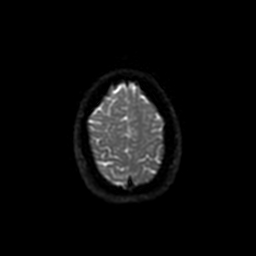
[im 68/68]
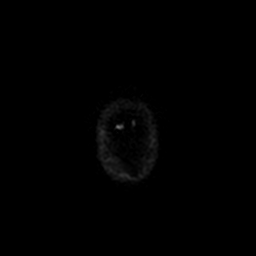

[Series 5: DWI · coronal · 5.0mm · 1.09mm/px · 9 of 70 slices shown (2 of 4)]
[im 1/70]
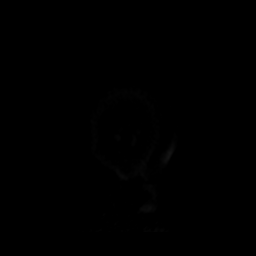
[im 9/70]
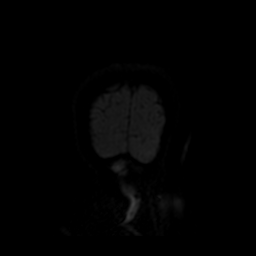
[im 18/70]
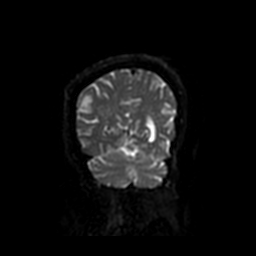
[im 26/70]
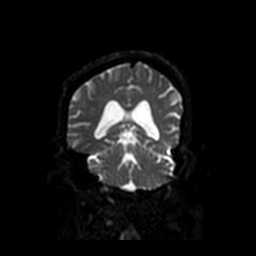
[im 35/70]
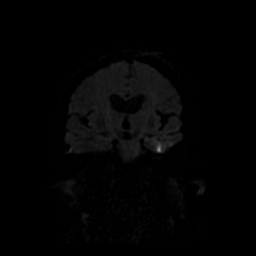
[im 44/70]
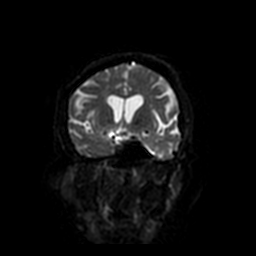
[im 52/70]
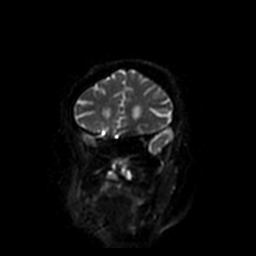
[im 61/70]
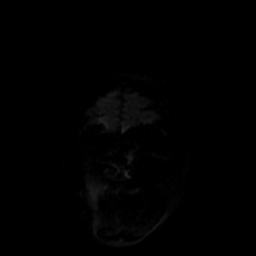
[im 70/70]
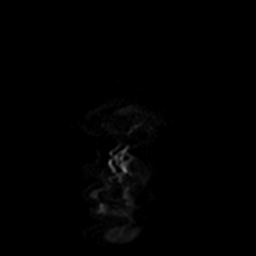

[Series 6: T2 · axial · 5.0mm · 0.43mm/px · z∈[-91,+71]mm · 3 of 28 slices shown (1 of 2)]
[im 1/28]
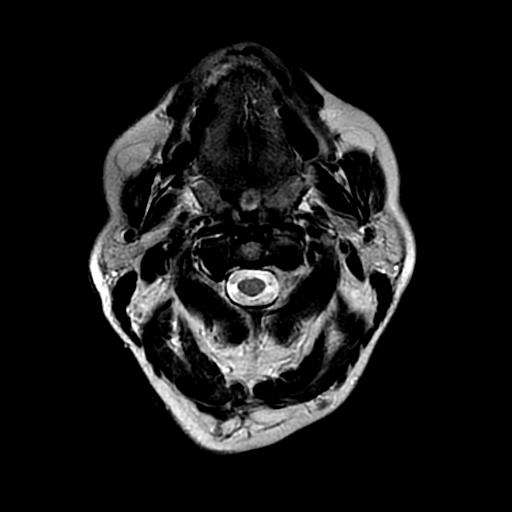
[im 14/28]
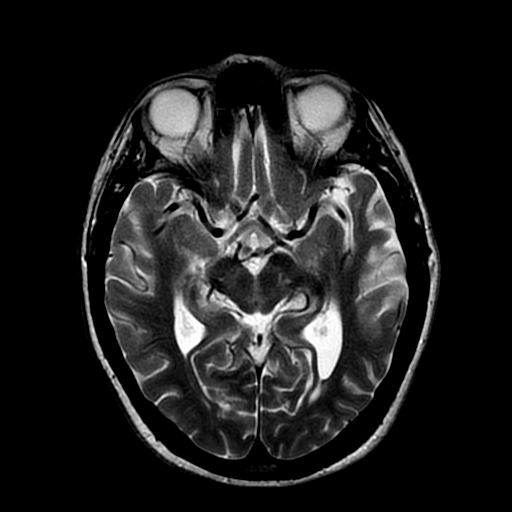
[im 28/28]
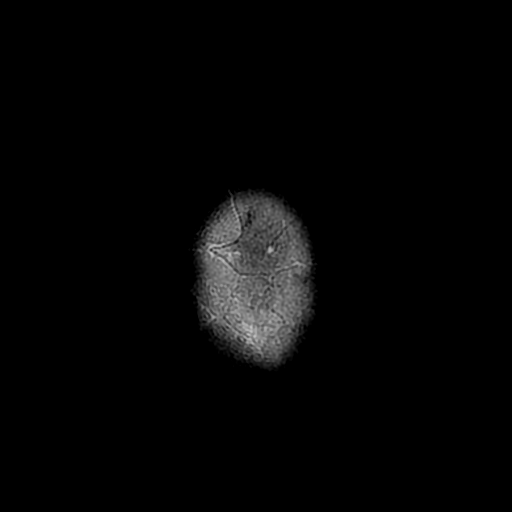

[Series 7: FLAIR · axial · 5.0mm · 0.43mm/px · z∈[-91,+71]mm · 3 of 28 slices shown]
[im 1/28]
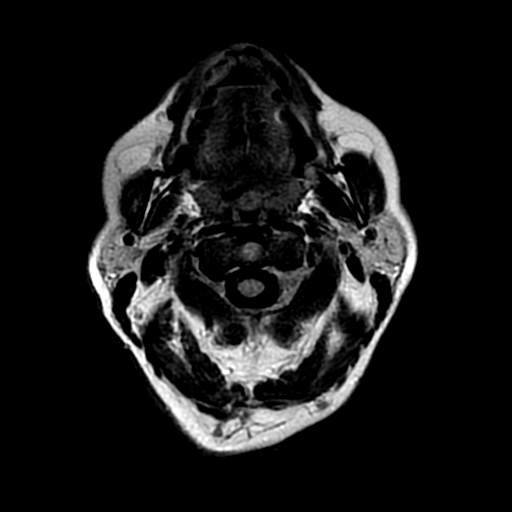
[im 14/28]
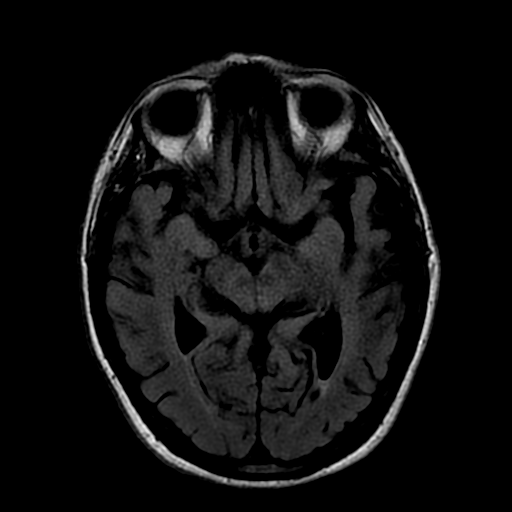
[im 28/28]
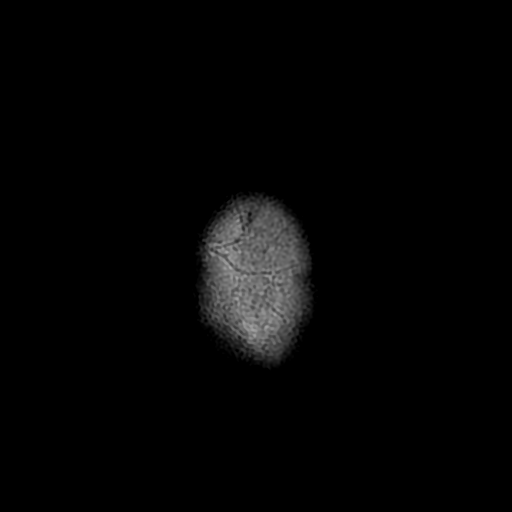

[Series 10: T2 · coronal · 5.0mm · 0.45mm/px · 3 of 28 slices shown (2 of 2)]
[im 1/28]
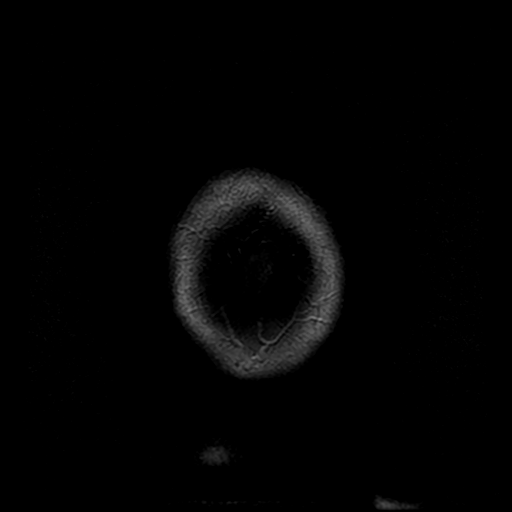
[im 14/28]
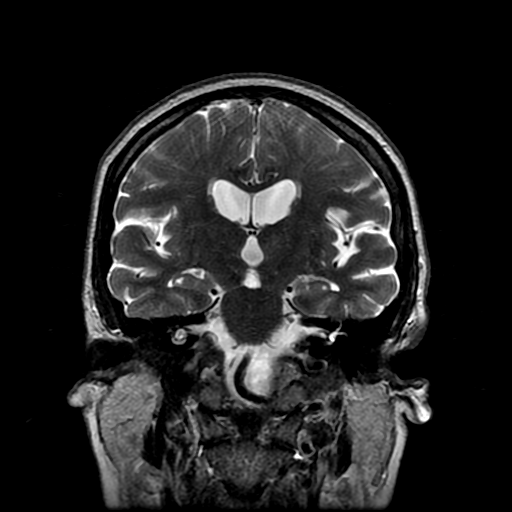
[im 28/28]
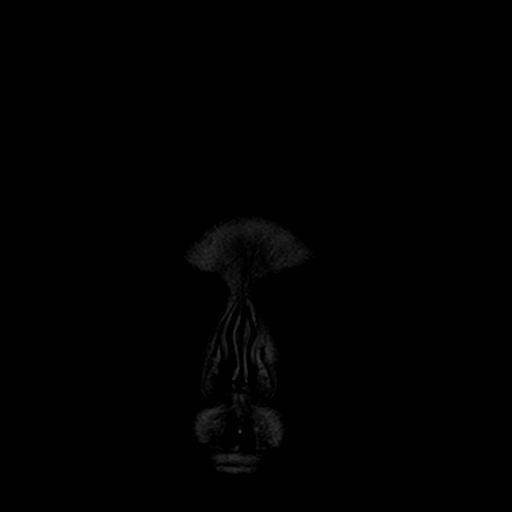

[Series 400: DWI · axial · 4.0mm · 1.17mm/px · z∈[-74,+73]mm · 4 of 34 slices shown (3 of 4)]
[im 1/34]
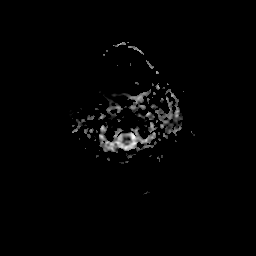
[im 12/34]
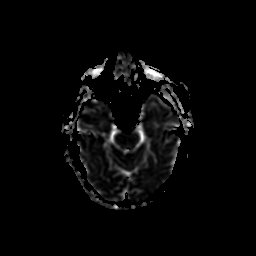
[im 23/34]
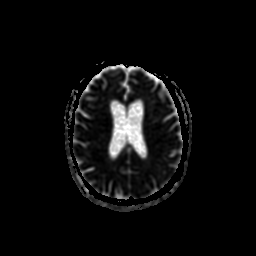
[im 34/34]
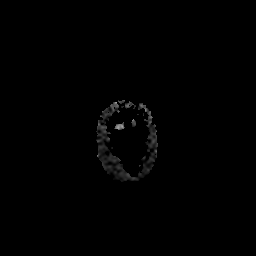

[Series 500: DWI · coronal · 5.0mm · 1.09mm/px · 4 of 35 slices shown (4 of 4)]
[im 1/35]
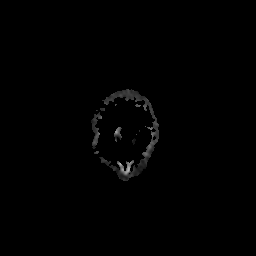
[im 12/35]
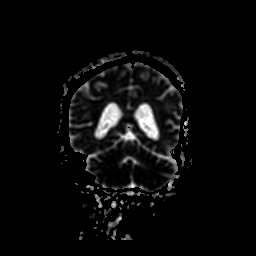
[im 23/35]
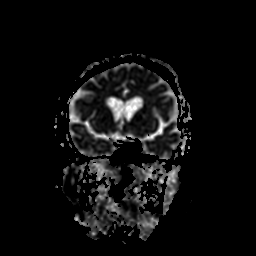
[im 35/35]
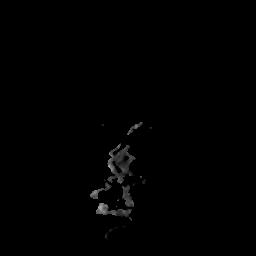

[36 of 48 positions shown; findings below may reference images not displayed]

FINDINGS: Brain: No evidence for acute infarction, hemorrhage, mass lesion,
hydrocephalus, or extra-axial fluid. Normal for age cerebral volume.
Mild subcortical and periventricular T2 and FLAIR hyperintensities,
likely chronic microvascular ischemic change.

Vascular: Flow voids are maintained throughout the carotid, basilar,
and vertebral arteries. LEFT vertebral is dominant. There are no
areas of chronic hemorrhage.

Skull and upper cervical spine: Unremarkable visualized calvarium,
skullbase, and cervical vertebrae. Pituitary, pineal, cerebellar
tonsils unremarkable. No upper cervical cord lesions.

Sinuses/Orbits: No orbital masses or proptosis. Globes appear
symmetric. Sinuses appear well aerated, without evidence for
air-fluid level. BILATERAL cataract extraction.

Other: No nasopharyngeal pathology or mastoid fluid. Scalp and other
visualized extracranial soft tissues grossly unremarkable.
IMPRESSION: No evidence of acute or chronic brainstem or cerebellar infarct.
Flow voids are maintained, with a robust and dominant LEFT vertebral
supplying the basilar.

Mild chronic microvascular ischemic changes, predominantly
supratentorial.

No middle ear/mastoid or paranasal sinus inflammatory process is
evident.

## 2019-05-05 DIAGNOSIS — R070 Pain in throat: Secondary | ICD-10-CM | POA: Diagnosis not present

## 2019-05-05 DIAGNOSIS — R072 Precordial pain: Secondary | ICD-10-CM | POA: Diagnosis not present

## 2019-05-30 ENCOUNTER — Encounter: Payer: Self-pay | Admitting: *Deleted

## 2019-06-29 DIAGNOSIS — Z1231 Encounter for screening mammogram for malignant neoplasm of breast: Secondary | ICD-10-CM | POA: Diagnosis not present

## 2019-07-05 DIAGNOSIS — D229 Melanocytic nevi, unspecified: Secondary | ICD-10-CM | POA: Diagnosis not present

## 2019-07-05 DIAGNOSIS — Z85820 Personal history of malignant melanoma of skin: Secondary | ICD-10-CM | POA: Diagnosis not present

## 2019-07-05 DIAGNOSIS — L570 Actinic keratosis: Secondary | ICD-10-CM | POA: Diagnosis not present

## 2019-09-07 DIAGNOSIS — I10 Essential (primary) hypertension: Secondary | ICD-10-CM | POA: Diagnosis not present

## 2019-09-07 DIAGNOSIS — F419 Anxiety disorder, unspecified: Secondary | ICD-10-CM | POA: Diagnosis not present

## 2019-09-07 DIAGNOSIS — E049 Nontoxic goiter, unspecified: Secondary | ICD-10-CM | POA: Diagnosis not present

## 2019-09-07 DIAGNOSIS — R7303 Prediabetes: Secondary | ICD-10-CM | POA: Diagnosis not present

## 2019-11-18 DIAGNOSIS — Z20828 Contact with and (suspected) exposure to other viral communicable diseases: Secondary | ICD-10-CM | POA: Diagnosis not present

## 2019-12-13 DIAGNOSIS — R42 Dizziness and giddiness: Secondary | ICD-10-CM | POA: Diagnosis not present

## 2019-12-31 ENCOUNTER — Ambulatory Visit: Payer: Medicare Other | Attending: Internal Medicine

## 2019-12-31 DIAGNOSIS — Z23 Encounter for immunization: Secondary | ICD-10-CM | POA: Insufficient documentation

## 2019-12-31 NOTE — Progress Notes (Signed)
   Covid-19 Vaccination Clinic  Name:  Pam Russell    MRN: [id number] DOB: Jul 28, 1946  12/31/2019  [name] was observed post Covid-19 immunization for 15 minutes without incidence. She was provided with Vaccine Information Sheet and instruction to access the V-Safe system.   [name] was instructed to call [phone] with any severe reactions post vaccine: Marland Kitchen Difficulty breathing  . Swelling of your face and throat  . A fast heartbeat  . A bad rash all over your body  . Dizziness and weakness    Immunizations Administered    Name Date Dose VIS Date Route   Pfizer COVID-19 Vaccine 12/31/2019 10:04 AM 0.3 mL 10/27/2019 Intramuscular   Manufacturer: [place]   Lot: [id number]   [place]: [id number]

## 2020-01-12 ENCOUNTER — Ambulatory Visit
Admission: RE | Admit: 2020-01-12 | Discharge: 2020-01-12 | Disposition: A | Discharge status: Home / Self Care | Payer: Medicare Other | Source: Ambulatory Visit | Attending: Physician Assistant | Admitting: Physician Assistant

## 2020-01-12 ENCOUNTER — Other Ambulatory Visit: Payer: Self-pay | Admitting: Physician Assistant

## 2020-01-12 DIAGNOSIS — E7800 Pure hypercholesterolemia, unspecified: Secondary | ICD-10-CM | POA: Diagnosis not present

## 2020-01-12 DIAGNOSIS — M546 Pain in thoracic spine: Secondary | ICD-10-CM

## 2020-01-12 DIAGNOSIS — I10 Essential (primary) hypertension: Secondary | ICD-10-CM | POA: Diagnosis not present

## 2020-01-12 DIAGNOSIS — Z8616 Personal history of COVID-19: Secondary | ICD-10-CM | POA: Diagnosis not present

## 2020-01-23 ENCOUNTER — Ambulatory Visit: Payer: Medicare Other | Attending: Internal Medicine

## 2020-01-23 DIAGNOSIS — Z23 Encounter for immunization: Secondary | ICD-10-CM | POA: Insufficient documentation

## 2020-01-23 NOTE — Progress Notes (Signed)
   Covid-19 Vaccination Clinic  Name:  Pam Russell    MRN: [id number] DOB: [birth year]  01/23/2020  [name] was observed post Covid-19 immunization for 15 minutes without incident. She was provided with Vaccine Information Sheet and instruction to access the V-Safe system.   [name] was instructed to call 911 with any severe reactions post vaccine: Marland Kitchen Difficulty breathing  . Swelling of face and throat  . A fast heartbeat  . A bad rash all over body  . Dizziness and weakness   Immunizations Administered    Name Date Dose VIS Date Route   Pfizer COVID-19 Vaccine 01/23/2020 11:30 AM 0.3 mL 10/27/2019 Intramuscular   Manufacturer: [place]   Lot: [id number]   [place]: [id number]

## 2020-03-12 DIAGNOSIS — E049 Nontoxic goiter, unspecified: Secondary | ICD-10-CM | POA: Diagnosis not present

## 2020-03-12 DIAGNOSIS — F419 Anxiety disorder, unspecified: Secondary | ICD-10-CM | POA: Diagnosis not present

## 2020-03-12 DIAGNOSIS — I10 Essential (primary) hypertension: Secondary | ICD-10-CM | POA: Diagnosis not present

## 2020-03-12 DIAGNOSIS — R7303 Prediabetes: Secondary | ICD-10-CM | POA: Diagnosis not present

## 2020-03-14 ENCOUNTER — Encounter: Payer: Self-pay | Admitting: *Deleted

## 2020-03-18 ENCOUNTER — Ambulatory Visit: Payer: Medicare Other | Admitting: Dermatology

## 2020-03-18 ENCOUNTER — Encounter: Payer: Self-pay | Admitting: Dermatology

## 2020-03-18 ENCOUNTER — Other Ambulatory Visit: Payer: Self-pay

## 2020-03-18 DIAGNOSIS — I8393 Asymptomatic varicose veins of bilateral lower extremities: Secondary | ICD-10-CM | POA: Diagnosis not present

## 2020-04-02 ENCOUNTER — Encounter: Payer: Self-pay | Admitting: Dermatology

## 2020-04-02 NOTE — Progress Notes (Signed)
   Follow-Up Visit   Subjective  Pam Russell is a 74 y.o. female who presents for the following: Consult Spider Veins (Patient here today to consult spider veins.  No other concerns.).  Enlarged leg veins Location: Legs Duration: Years Quality:  Associated Signs/Symptoms: Modifying Factors: Previous successful sclerotherapy Severity:  Timing: Context:   The following portions of the chart were reviewed this encounter and updated as appropriate: Tobacco  Allergies  Meds  Problems  Med Hx  Surg Hx  Fam Hx      Objective  Well appearing patient in no apparent distress; mood and affect are within normal limits.  A focused examination was performed including Legs and pedal pulses examined. Relevant physical exam findings are noted in the Assessment and Plan.   Assessment & Plan  Spider veins of both lower extremities Right Lower Leg - Anterior  25-minute discussion about all treatment options focus on sclerotherapy with either saline or polidocanol

## 2020-04-17 ENCOUNTER — Ambulatory Visit (INDEPENDENT_AMBULATORY_CARE_PROVIDER_SITE_OTHER): Payer: Medicare Other | Admitting: Dermatology

## 2020-04-17 ENCOUNTER — Encounter: Payer: Self-pay | Admitting: Dermatology

## 2020-04-17 ENCOUNTER — Other Ambulatory Visit: Payer: Self-pay

## 2020-04-17 DIAGNOSIS — I8393 Asymptomatic varicose veins of bilateral lower extremities: Secondary | ICD-10-CM

## 2020-04-27 ENCOUNTER — Encounter: Payer: Self-pay | Admitting: Dermatology

## 2020-04-27 NOTE — Progress Notes (Signed)
   Follow-Up Visit   Subjective  Pam Russell is a 74 y.o. female who presents for the following: Skin Problem (leg veins).  Leg veins Location:  Duration:  Quality:  Associated Signs/Symptoms: Modifying Factors:  Severity:  Timing: Context: Patient requests treatment  The following portions of the chart were reviewed this encounter and updated as appropriate:     Objective  Well appearing patient in no apparent distress; mood and affect are within normal limits.  A focused examination was performed including Legs.. Relevant physical exam findings are noted in the Assessment and Plan.   Assessment & Plan  Spider veins of both lower extremities (8) Left Thigh - Anterior; Right Thigh - Anterior; Left Thigh - Posterior; Right Thigh - Posterior; Left Lower Leg - Anterior; Right Lower Leg - Anterior; Left Lower Leg - Posterior; Right Lower Leg - Posterior  Sclerotherapy with 23.4% saline, total 4 cc, 1 hour.  Excellent technical success and excellent patient tolerance.

## 2020-07-05 DIAGNOSIS — Z1231 Encounter for screening mammogram for malignant neoplasm of breast: Secondary | ICD-10-CM | POA: Diagnosis not present

## 2020-07-18 DIAGNOSIS — R921 Mammographic calcification found on diagnostic imaging of breast: Secondary | ICD-10-CM | POA: Diagnosis not present

## 2020-08-10 ENCOUNTER — Emergency Department (HOSPITAL_COMMUNITY)
Admission: EM | Admit: 2020-08-10 | Discharge: 2020-08-10 | Disposition: A | Discharge status: Home / Self Care | Payer: Medicare Other | Attending: Emergency Medicine | Admitting: Emergency Medicine

## 2020-08-10 ENCOUNTER — Encounter (HOSPITAL_COMMUNITY): Payer: Self-pay | Admitting: Emergency Medicine

## 2020-08-10 ENCOUNTER — Emergency Department (HOSPITAL_COMMUNITY): Payer: Medicare Other

## 2020-08-10 DIAGNOSIS — Z85828 Personal history of other malignant neoplasm of skin: Secondary | ICD-10-CM | POA: Diagnosis not present

## 2020-08-10 DIAGNOSIS — R0789 Other chest pain: Secondary | ICD-10-CM | POA: Diagnosis not present

## 2020-08-10 DIAGNOSIS — I10 Essential (primary) hypertension: Secondary | ICD-10-CM | POA: Diagnosis not present

## 2020-08-10 DIAGNOSIS — I201 Angina pectoris with documented spasm: Secondary | ICD-10-CM | POA: Diagnosis not present

## 2020-08-10 DIAGNOSIS — I200 Unstable angina: Secondary | ICD-10-CM | POA: Diagnosis not present

## 2020-08-10 DIAGNOSIS — R6884 Jaw pain: Secondary | ICD-10-CM | POA: Diagnosis not present

## 2020-08-10 DIAGNOSIS — Z79899 Other long term (current) drug therapy: Secondary | ICD-10-CM | POA: Insufficient documentation

## 2020-08-10 DIAGNOSIS — R079 Chest pain, unspecified: Secondary | ICD-10-CM | POA: Diagnosis not present

## 2020-08-10 DIAGNOSIS — Z7982 Long term (current) use of aspirin: Secondary | ICD-10-CM | POA: Insufficient documentation

## 2020-08-10 LAB — BASIC METABOLIC PANEL
Anion gap: 12 (ref 5–15)
BUN: 13 mg/dL (ref 8–23)
CO2: 23 mmol/L (ref 22–32)
Calcium: 9.5 mg/dL (ref 8.9–10.3)
Chloride: 104 mmol/L (ref 98–111)
Creatinine, Ser: 0.72 mg/dL (ref 0.44–1.00)
GFR calc Af Amer: >60 mL/min (ref >60)
GFR calc non Af Amer: >60 mL/min (ref >60)
Glucose, Bld: 106 mg/dL — ABNORMAL HIGH (ref 70–99)
Potassium: 4.1 mmol/L (ref 3.5–5.1)
Sodium: 139 mmol/L (ref 135–145)

## 2020-08-10 LAB — CBC
HCT: 48.3 % — ABNORMAL HIGH (ref 36.0–46.0)
Hemoglobin: 15 g/dL (ref 12.0–15.0)
MCH: 26 pg (ref 26.0–34.0)
MCHC: 31.1 g/dL (ref 30.0–36.0)
MCV: 83.6 fL (ref 80.0–100.0)
Platelets: 246 10*3/uL (ref 150–400)
RBC: 5.78 MIL/uL — ABNORMAL HIGH (ref 3.87–5.11)
RDW: 13.6 % (ref 11.5–15.5)
WBC: 6.5 10*3/uL (ref 4.0–10.5)
nRBC: 0 % (ref 0.0–0.2)

## 2020-08-10 LAB — TROPONIN I (HIGH SENSITIVITY)
Troponin I (High Sensitivity): 4 ng/L (ref <18)
Troponin I (High Sensitivity): 3 ng/L (ref <18)

## 2020-08-10 MED ORDER — DIAZEPAM 2 MG PO TABS
2.0000 mg | ORAL_TABLET | Freq: Once | ORAL | Status: AC
Start: 2020-08-10 — End: 2020-08-10
  Administered 2020-08-10: 2 mg via ORAL
  Filled 2020-08-10: qty 1

## 2020-08-10 MED ORDER — NAPROXEN 375 MG PO TABS: 375.0000 mg | ORAL_TABLET | Freq: Two times a day (BID) | ORAL | 0 refills | Status: AC

## 2020-08-10 NOTE — ED Provider Notes (Signed)
Patient seen and examined, agree with assessment and plan by APP. Patient here primarily for jaw pain, worse with palpation/chewing, likely TMJ but also reported to UC an episode of chest tightness yesterday which she attributes to being worried. No chest pain today. She has normal workup. Benign exam. Doubt ACS but given age and risk factors, will offer hospital eval vs outpatient eval.    Truddie Hidden, MD 08/10/20 1710

## 2020-08-10 NOTE — ED Triage Notes (Signed)
EMS stated, we got called out from [facility] for jaw pain radiating and some angina. Pt took 2 baby ASA and EMS gave her ASA 324mg  po IV 18 g Rt. Forearm.

## 2020-08-10 NOTE — ED Triage Notes (Signed)
Pt. Stated, the pain in my jaw actually started Thursday evening.

## 2020-08-10 NOTE — ED Provider Notes (Addendum)
[place] EMERGENCY DEPARTMENT Provider Note   CSN: [id number] Arrival date & time: 08/10/20  1257     History Chief Complaint  Patient presents with  . Chest Pain  . Jaw Pain    Pam Russell is a 74 y.o. female.  HPI  HPI: A 74 year old patient with a history of hypertension presents for evaluation of chest pain. Initial onset of pain was more than 6 hours ago. The patient's chest pain is described as heaviness/pressure/tightness and is not worse with exertion. The patient's chest pain is middle- or left-sided, is not well-localized, is not sharp and does radiate to the arms/jaw/neck. The patient does not complain of nausea and denies diaphoresis. The patient has no history of stroke, has no history of peripheral artery disease, has not smoked in the past 90 days, denies any history of treated diabetes, has no relevant family history of coronary artery disease (first degree relative at less than age 29), has no history of hypercholesterolemia and does not have an elevated BMI (>=30).   Pt is a 74 y/o female with a h/o MVP,  who presents to the ED today for eval of jaw pain that started 2-3 days ago. She states she went to urgent care pta to have this evaluated and they became concerned because she had also reported chest pressure. They sent her here for further evaluation.   Chest pressure: located to the left side of the chest. States that it does not feel painful. Pain was constant and seemed to wax and wane. She has not have any pain today. Pain nonexertional. Denies associated SOB, diaphoresis, nausea, vomiting, lightheadedness/dizziness, cough, hemoptysis, BLE swelling. Denies leg pain/swelling, hemoptysis, recent surgery, recent long travel, hormone use, personal hx of cancer, or hx of DVT/PE.   Jaw pain: Pain located to the left side of the jaw. Constant for 2-3 days. Painful when opening/closing mouth and when laying on left side. Does not know if she grits  her teeth. Denies dental pain. Pain rated 8/10.   Pt was transported her by EMS and received ASA en route which she states improved her pain.  Denies h/o tobacco use. Denies any early family hx of heart disease. Has h/o HTN. No h/o HLD or DM. Has had multiple neg stress tests in the past.   Past Medical History:  Diagnosis Date  . Hypertension   . Malignant melanoma in situ ([place]) 05/06/2018   post right shoulder-exc  . Superficial basal cell carcinoma (BCC) 09/07/2018   [id number])    Patient Active Problem List   Diagnosis Date Noted  . Benign essential HTN 01/14/2015    Past Surgical History:  Procedure Laterality Date  . ABDOMINAL HYSTERECTOMY    . BLADDER SURGERY    . CHOLECYSTECTOMY    . TUBAL LIGATION       OB History   No obstetric history on file.     No family history on file.  Social History   Tobacco Use  . Smoking status: Never Smoker  . Smokeless tobacco: Never Used  Vaping Use  . Vaping Use: Never used  Substance Use Topics  . Alcohol use: Yes    Alcohol/week: 2.0 standard drinks    Types: 2 Standard drinks or equivalent per week  . Drug use: No    Home Medications Prior to Admission medications   Medication Sig Start Date End Date Taking? Authorizing Provider  aspirin 81 MG tablet Take 162 mg by mouth daily.    Yes  [provider]  cholecalciferol (VITAMIN D) 1000 UNITS tablet Take 2,000 Units by mouth daily.    Yes [provider]  lisinopril (PRINIVIL,ZESTRIL) 20 MG tablet Take 20 mg by mouth daily. 03/09/17  Yes [provider]  magnesium 30 MG tablet Take 30 mg by mouth daily.    Yes [provider]  metoprolol succinate (TOPROL-XL) 50 MG 24 hr tablet Take 50 mg by mouth daily. Take with or immediately following a meal.   Yes [provider]  meclizine (ANTIVERT) 12.5 MG tablet Take 1 tablet (12.5 mg total) by mouth 3 (three) times daily as needed for dizziness. Patient not taking: Reported on  08/10/2020 05/09/17   Robinson, [place] N, PA-C  naproxen (NAPROSYN) 375 MG tablet Take 1 tablet (375 mg total) by mouth 2 (two) times daily with a meal for 7 days. 08/10/20 08/17/20  Wania Longstreth S, PA-C  ondansetron (ZOFRAN ODT) 4 MG disintegrating tablet Take 1 tablet (4 mg total) by mouth every 8 (eight) hours as needed for nausea or vomiting. Patient not taking: Reported on 08/10/2020 05/09/17   Robinson, [place] N, PA-C    Allergies    Erythromycin and Sulfa antibiotics  Review of Systems   Review of Systems  Constitutional: Negative for diaphoresis and fever.  HENT:       Left sided jaw pain  Eyes: Negative for visual disturbance.  Respiratory: Negative for cough and shortness of breath.   Cardiovascular: Positive for chest pain ("pressure not pain"). Negative for palpitations and leg swelling.  Gastrointestinal: Negative for abdominal pain, diarrhea, nausea and vomiting.  Genitourinary: Negative for hematuria.  Musculoskeletal: Negative for back pain.  Skin: Negative for rash.  Neurological: Negative for headaches.  All other systems reviewed and are negative.   Physical Exam Updated Vital Signs BP 125/77   Pulse 63   Temp 97.8 F (36.6 C) (Oral)   Resp 15   SpO2 97%   Physical Exam Vitals and nursing note reviewed.  Constitutional:      General: She is not in acute distress.    Appearance: She is well-developed.  HENT:     Head: Normocephalic and atraumatic.     Mouth/Throat:     Comments: TTP to the left TMJ region which reproduces pain. No TTP to the teeth.  Eyes:     Conjunctiva/sclera: Conjunctivae normal.  Cardiovascular:     Rate and Rhythm: Normal rate and regular rhythm.     Heart sounds: No murmur heard.   Pulmonary:     Effort: Pulmonary effort is normal. No respiratory distress.     Breath sounds: Normal breath sounds. No decreased breath sounds, wheezing, rhonchi or rales.  Abdominal:     Palpations: Abdomen is soft.     Tenderness: There is no  abdominal tenderness. There is no guarding or rebound.  Musculoskeletal:     Cervical back: Neck supple.     Right lower leg: No tenderness. No edema.     Left lower leg: No tenderness. No edema.  Skin:    General: Skin is warm and dry.  Neurological:     Mental Status: She is alert.     ED Results / Procedures / Treatments   Labs (all labs ordered are listed, but only abnormal results are displayed) Labs Reviewed  BASIC METABOLIC PANEL - Abnormal; Notable for the following components:      Result Value   Glucose, Bld 106 (*)    All other components within normal limits  CBC - Abnormal; Notable for the following components:   RBC 5.78 (*)    HCT 48.3 (*)    All other components within normal limits  TROPONIN I (HIGH SENSITIVITY)  TROPONIN I (HIGH SENSITIVITY)    EKG EKG Interpretation  Date/Time:  Saturday August 10 2020 13:04:14 EDT Ventricular Rate:  67 PR Interval:  150 QRS Duration: 88 QT Interval:  424 QTC Calculation: 448 R Axis:   40 Text Interpretation: Normal sinus rhythm Normal ECG No significant change since last tracing Confirmed by Calvert Cantor [phone]) on 08/10/2020 3:30:13 PM   Radiology DG Chest 2 View  Result Date: 08/10/2020 CLINICAL DATA:  Chest pain. EXAM: CHEST - 2 VIEW COMPARISON:  January 12, 2020 FINDINGS: Cardiomediastinal silhouette is normal. Mediastinal contours appear intact. Calcific atherosclerotic disease of the aorta. There is no evidence of focal airspace consolidation, pleural effusion or pneumothorax. Osseous structures are without acute abnormality. Soft tissues are grossly normal. IMPRESSION: No active cardiopulmonary disease. Electronically Signed   By: Fidela Salisbury M.D.   On: 08/10/2020 14:31    Procedures Procedures (including critical care time)  Medications Ordered in ED Medications  diazepam (VALIUM) tablet 2 mg (2 mg Oral Given 08/10/20 1601)    ED Course  I have reviewed the triage vital signs and the  nursing notes.  Pertinent labs & imaging results that were available during my care of the patient were reviewed by me and considered in my medical decision making (see chart for details).    MDM Rules/Calculators/A&P HEAR Score: 85                        74 year old female presenting for evaluation of jaw pain and chest pain. Originally went to urgent care due to jaw pain for the last several days and they sent her here because she had reported some chest pressure.  She has some TTP to the left TMJ region and her exam is otherwise unremarkable.  Reviewed/interpreted lab CBC is without leukocytosis or anemia BMP with normal electrolytes and kidney function Initial troponin negative, delta troponin negative troponing  EKG showed normal sinus rhythm with no acute ischemic changes  Chest x-ray reviewed/interpreted which shows no active cardiopulmonary disease  Reassessed patient.  She feels significantly improved and her jaw pain is now resolved after the Valium.  She does not have any chest pain or other symptoms at this time.  Her work-up is been reassuring and I have low suspicion for ACS.  Her heart score is 4.  We had a discussion about admission for further work-up and chest pain rule out however she would like to be discharged and follow-up with her cardiologist as an outpatient I do feel is reasonable at this time.  We will also prescribe anti-inflammatory for her jaw pain as I suspect this may be related to TMJ syndrome.  Advised PCP follow-up for this symptom.  Advised once with return precautions.  She voiced understanding the plan and reasons to return.  Questions answered.  Patient stable for discharge.  Pt seen in conjunction with Dr. Karle Starch who personally evaluated the patient and is in agreement with the plan.   Final Clinical Impression(s) / ED Diagnoses Final diagnoses:  Jaw pain  Chest pressure    Rx / DC Orders ED Discharge Orders         Ordered    naproxen  (NAPROSYN) 375 MG tablet  2 times daily with meals  08/10/20 [place], Janayah Zavada S, PA-C 08/10/20 1746    Jabri Blancett, [place], PA-C 08/10/20 1747    Truddie Hidden, MD 08/10/20 [phone]

## 2020-08-10 NOTE — Discharge Instructions (Signed)
You may alternate taking Tylenol and Naproxen as needed for pain control. You may take Naproxen twice daily as directed on your discharge paperwork and you may take  [phone] mg of Tylenol every 6 hours. Do not exceed 4000 mg of Tylenol daily as this can lead to liver damage. Also, make sure to take Naproxen with meals as it can cause an upset stomach. Do not take other NSAIDs while taking Naproxen such as (Aleve, Ibuprofen, Aspirin, Celebrex, etc) and do not take more than the prescribed dose as this can lead to ulcers and bleeding in your GI tract. You may use warm and cold compresses to help with your symptoms.   Please follow up with your primary doctor and with cardiology within the next 7-10 days for re-evaluation and further treatment of your symptoms.   Please return to the ER sooner if you have any new or worsening symptoms.

## 2020-09-03 ENCOUNTER — Other Ambulatory Visit: Payer: Self-pay

## 2020-09-03 DIAGNOSIS — N6489 Other specified disorders of breast: Secondary | ICD-10-CM | POA: Diagnosis not present

## 2020-09-03 DIAGNOSIS — R921 Mammographic calcification found on diagnostic imaging of breast: Secondary | ICD-10-CM | POA: Diagnosis not present

## 2020-09-19 DIAGNOSIS — F419 Anxiety disorder, unspecified: Secondary | ICD-10-CM | POA: Diagnosis not present

## 2020-09-19 DIAGNOSIS — I10 Essential (primary) hypertension: Secondary | ICD-10-CM | POA: Diagnosis not present

## 2020-09-19 DIAGNOSIS — Z0000 Encounter for general adult medical examination without abnormal findings: Secondary | ICD-10-CM | POA: Diagnosis not present

## 2020-09-19 DIAGNOSIS — R7303 Prediabetes: Secondary | ICD-10-CM | POA: Diagnosis not present

## 2020-10-19 ENCOUNTER — Ambulatory Visit: Payer: Medicare Other | Attending: Internal Medicine

## 2020-10-19 DIAGNOSIS — Z23 Encounter for immunization: Secondary | ICD-10-CM

## 2020-10-19 NOTE — Progress Notes (Signed)
   Covid-19 Vaccination Clinic  Name:  Pam Russell    MRN: [id number] DOB: 03-02-1946  10/19/2020  Ms. Pam Russell was observed post Covid-19 immunization for 15 minutes without incident. She was provided with Vaccine Information Sheet and instruction to access the V-Safe system.   Ms. Pam Russell was instructed to call 911 with any severe reactions post vaccine: Marland Kitchen Difficulty breathing  . Swelling of face and throat  . A fast heartbeat  . A bad rash all over body  . Dizziness and weakness   Immunizations Administered    Name Date Dose VIS Date Route   Pfizer COVID-19 Vaccine 10/19/2020 11:17 AM 0.3 mL 09/04/2020 Intramuscular   Manufacturer: [place]   Lot: [id number]   NDC: [address]-2    '

## 2020-12-11 ENCOUNTER — Other Ambulatory Visit: Payer: Self-pay

## 2020-12-11 ENCOUNTER — Ambulatory Visit: Payer: Medicare Other | Admitting: Dermatology

## 2020-12-11 ENCOUNTER — Encounter: Payer: Self-pay | Admitting: Dermatology

## 2020-12-11 DIAGNOSIS — L821 Other seborrheic keratosis: Secondary | ICD-10-CM

## 2020-12-11 DIAGNOSIS — L820 Inflamed seborrheic keratosis: Secondary | ICD-10-CM | POA: Diagnosis not present

## 2020-12-11 DIAGNOSIS — Z1283 Encounter for screening for malignant neoplasm of skin: Secondary | ICD-10-CM

## 2020-12-11 DIAGNOSIS — D2239 Melanocytic nevi of other parts of face: Secondary | ICD-10-CM | POA: Diagnosis not present

## 2020-12-11 DIAGNOSIS — D485 Neoplasm of uncertain behavior of skin: Secondary | ICD-10-CM

## 2020-12-11 NOTE — Patient Instructions (Signed)

## 2020-12-17 ENCOUNTER — Telehealth: Payer: Self-pay | Admitting: *Deleted

## 2020-12-17 NOTE — Telephone Encounter (Signed)
Pathology to patient.  °

## 2020-12-20 NOTE — Progress Notes (Signed)
   Follow-Up Visit   Subjective  Pam Russell is a 75 y.o. female who presents for the following: Annual Exam (No new cocerns- check spots on back).  Itchy spot on back and possible enlargement of bump left cheek Location:  Duration:  Quality:  Associated Signs/Symptoms: Modifying Factors:  Severity:  Timing: Context:   Objective  Well appearing patient in no apparent distress; mood and affect are within normal limits. Objective  Chest - Medial [facility]): Waist up skin examination, no atypical moles or melanoma.  Objective  Left Buccal Cheek: Pearly 5 mm flesh-colored papule, dermal nevus rule out BCC     Objective  Mid Back (10): 2 dozen tan to brown textured flat 5 to 8 mm papules, one lesion mid back pink and inflamed    All skin waist up examined.   Assessment & Plan    Screening exam for skin cancer Chest - Medial [facility])  Annual skin examination, patient encouraged to examine her own skin twice annually.  Neoplasm of uncertain behavior of skin Left Buccal Cheek  Skin / nail biopsy Type of biopsy: tangential   Informed consent: discussed and consent obtained   Timeout: patient name, date of birth, surgical site, and procedure verified   Anesthesia: the lesion was anesthetized in a standard fashion   Anesthetic:  1% lidocaine w/ epinephrine 1-100,000 local infiltration Instrument used: flexible razor blade   Hemostasis achieved with: aluminum chloride and electrodesiccation   Outcome: patient tolerated procedure well   Post-procedure details: wound care instructions given    Specimen 1 - Surgical pathology Differential Diagnosis: bcc scc  Check Margins: No  Inflamed seborrheic keratosis (10) Mid Back  Leave all except inflamed lesion which was treated with LN2 freeze; return for biopsy if this fails.  Destruction of lesion - Mid Back Complexity: simple   Destruction method: cryotherapy   Informed consent: discussed and consent  obtained   Timeout:  patient name, date of birth, surgical site, and procedure verified Lesion destroyed using liquid nitrogen: Yes   Cryotherapy cycles:  3 Outcome: patient tolerated procedure well with no complications   Post-procedure details: wound care instructions given        I, Lavonna Monarch, MD, have reviewed all documentation for this visit.  The documentation on 12/20/20 for the exam, diagnosis, procedures, and orders are all accurate and complete.

## 2021-03-19 ENCOUNTER — Ambulatory Visit: Payer: Medicare Other | Attending: Internal Medicine

## 2021-03-19 DIAGNOSIS — E049 Nontoxic goiter, unspecified: Secondary | ICD-10-CM | POA: Diagnosis not present

## 2021-03-19 DIAGNOSIS — Z23 Encounter for immunization: Secondary | ICD-10-CM

## 2021-03-19 DIAGNOSIS — F419 Anxiety disorder, unspecified: Secondary | ICD-10-CM | POA: Diagnosis not present

## 2021-03-19 DIAGNOSIS — R7303 Prediabetes: Secondary | ICD-10-CM | POA: Diagnosis not present

## 2021-03-19 DIAGNOSIS — I10 Essential (primary) hypertension: Secondary | ICD-10-CM | POA: Diagnosis not present

## 2021-03-19 NOTE — Progress Notes (Signed)
   Covid-19 Vaccination Clinic  Name:  Pam Russell    MRN: [id number] DOB: 09-11-1946  03/19/2021  Pam Russell was observed post Covid-19 immunization for 15 minutes without incident. She was provided with Vaccine Information Sheet and instruction to access the V-Safe system.   Pam Russell was instructed to call 911 with any severe reactions post vaccine: Marland Kitchen Difficulty breathing  . Swelling of face and throat  . A fast heartbeat  . A bad rash all over body  . Dizziness and weakness   Immunizations Administered    Name Date Dose VIS Date Route   PFIZER Comrnaty(Gray TOP) Covid-19 Vaccine 03/19/2021 11:11 AM 0.3 mL 10/24/2020 Intramuscular   Manufacturer: [employer]   Lot: [id number]   NDC: [phone]

## 2021-03-20 ENCOUNTER — Other Ambulatory Visit (HOSPITAL_COMMUNITY): Payer: Self-pay

## 2021-03-20 MED ORDER — COVID-19 MRNA VAC-TRIS(PFIZER) 30 MCG/0.3ML IM SUSP
INTRAMUSCULAR | 0 refills | Status: AC
  Filled 2021-03-20: qty 0.3, 17d supply, fill #0

## 2021-06-18 ENCOUNTER — Encounter: Payer: Self-pay | Admitting: Physician Assistant

## 2021-06-18 ENCOUNTER — Other Ambulatory Visit: Payer: Self-pay

## 2021-06-18 ENCOUNTER — Ambulatory Visit: Payer: Medicare Other | Admitting: Physician Assistant

## 2021-06-18 DIAGNOSIS — Z1283 Encounter for screening for malignant neoplasm of skin: Secondary | ICD-10-CM

## 2021-06-18 DIAGNOSIS — L570 Actinic keratosis: Secondary | ICD-10-CM

## 2021-06-18 DIAGNOSIS — Z86006 Personal history of melanoma in-situ: Secondary | ICD-10-CM

## 2021-06-18 DIAGNOSIS — L820 Inflamed seborrheic keratosis: Secondary | ICD-10-CM | POA: Diagnosis not present

## 2021-06-18 DIAGNOSIS — C44619 Basal cell carcinoma of skin of left upper limb, including shoulder: Secondary | ICD-10-CM

## 2021-06-18 DIAGNOSIS — C4491 Basal cell carcinoma of skin, unspecified: Secondary | ICD-10-CM

## 2021-06-18 DIAGNOSIS — D485 Neoplasm of uncertain behavior of skin: Secondary | ICD-10-CM

## 2021-06-18 HISTORY — DX: Basal cell carcinoma of skin, unspecified: C44.91

## 2021-06-18 NOTE — Progress Notes (Addendum)
   Follow-Up Visit   Subjective  Pam Russell is a 75 y.o. female who presents for the following: Skin Problem (Patient here today for check on her right ear, patient states that 3 months ago she had a crusty place on her ear and now it's gone. Patient would still like ear checked. ). She has a personal history of MIS.   The following portions of the chart were reviewed this encounter and updated as appropriate:  Tobacco  Allergies  Meds  Problems  Med Hx  Surg Hx  Fam Hx      Objective  Well appearing patient in no apparent distress; mood and affect are within normal limits.  A full examination was performed including scalp, head, eyes, ears, nose, lips, neck, chest, axillae, abdomen, back, buttocks, bilateral upper extremities, bilateral lower extremities, hands, feet, fingers, toes, fingernails, and toenails. All findings within normal limits unless otherwise noted below.  Generalized (chest and back) (15) Erythematous stuck-on, waxy papule or plaque.   Right Breast Erythematous patches with gritty scale.  Left Forearm - Anterior Raised plaque- pink     Assessment & Plan  AK (actinic keratosis) Right Breast  Destruction of lesion - Right Breast Complexity: simple   Destruction method: cryotherapy   Informed consent: discussed and consent obtained   Timeout:  patient name, date of birth, surgical site, and procedure verified Lesion destroyed using liquid nitrogen: Yes   Cryotherapy cycles:  1 Outcome: patient tolerated procedure well with no complications   Post-procedure details: wound care instructions given    Inflamed seborrheic keratosis Generalized (chest and back)  Destruction of lesion - Generalized (chest and back) Complexity: simple   Destruction method: cryotherapy   Informed consent: discussed and consent obtained   Timeout:  patient name, date of birth, surgical site, and procedure verified Lesion destroyed using liquid nitrogen: Yes    Cryotherapy cycles:  1 Outcome: patient tolerated procedure well with no complications   Post-procedure details: wound care instructions given    BCC (basal cell carcinoma), arm, left Left Forearm - Anterior  Epidermal / dermal shaving  Lesion diameter (cm):  0.5 Informed consent: discussed and consent obtained   Timeout: patient name, date of birth, surgical site, and procedure verified   Procedure prep:  Patient was prepped and draped in usual sterile fashion Prep type:  Chlorhexidine Anesthesia: the lesion was anesthetized in a standard fashion   Anesthetic:  1% lidocaine w/ epinephrine 1-100,000 local infiltration Instrument used: DermaBlade   Hemostasis achieved with: aluminum chloride   Outcome: patient tolerated procedure well   Post-procedure details: sterile dressing applied and wound care instructions given   Dressing type: petrolatum gauze, petrolatum and bandage    Specimen 1 - Surgical pathology Differential Diagnosis: SCC vs BCC, Atypia  Check Margins: Yes   I, Nayelli Inglis, PA-C, have reviewed all documentation's for this visit.  The documentation on 06/24/21 for the exam, diagnosis, procedures and orders are all accurate and complete.

## 2021-06-18 NOTE — Patient Instructions (Signed)

## 2021-06-24 ENCOUNTER — Telehealth: Payer: Self-pay

## 2021-06-24 NOTE — Addendum Note (Signed)
Addended by: Warren Danes on: 06/24/2021 02:48 PM   Modules accepted: Orders

## 2021-06-24 NOTE — Telephone Encounter (Signed)
Phone call to patient with her pathology results. Patient aware of results.  

## 2021-06-24 NOTE — Telephone Encounter (Signed)
-----   Message from Warren Danes, [place] sent at 06/24/2021  2:43 PM EDT ----- Recheck 3 mo. RTC if recurs

## 2021-07-15 DIAGNOSIS — N6081 Other benign mammary dysplasias of right breast: Secondary | ICD-10-CM | POA: Diagnosis not present

## 2021-08-26 DIAGNOSIS — H0102B Squamous blepharitis left eye, upper and lower eyelids: Secondary | ICD-10-CM | POA: Diagnosis not present

## 2021-08-26 DIAGNOSIS — Z961 Presence of intraocular lens: Secondary | ICD-10-CM | POA: Diagnosis not present

## 2021-08-26 DIAGNOSIS — H0102A Squamous blepharitis right eye, upper and lower eyelids: Secondary | ICD-10-CM | POA: Diagnosis not present

## 2021-09-04 DIAGNOSIS — M25512 Pain in left shoulder: Secondary | ICD-10-CM | POA: Diagnosis not present

## 2021-09-04 DIAGNOSIS — M542 Cervicalgia: Secondary | ICD-10-CM | POA: Diagnosis not present

## 2021-09-25 ENCOUNTER — Encounter: Payer: Self-pay | Admitting: Physician Assistant

## 2021-09-25 ENCOUNTER — Other Ambulatory Visit: Payer: Self-pay

## 2021-09-25 ENCOUNTER — Ambulatory Visit: Payer: Medicare Other | Admitting: Physician Assistant

## 2021-09-25 DIAGNOSIS — D485 Neoplasm of uncertain behavior of skin: Secondary | ICD-10-CM

## 2021-09-25 DIAGNOSIS — L821 Other seborrheic keratosis: Secondary | ICD-10-CM | POA: Diagnosis not present

## 2021-09-25 NOTE — Patient Instructions (Signed)

## 2021-09-29 ENCOUNTER — Encounter: Payer: Self-pay | Admitting: Physician Assistant

## 2021-09-29 NOTE — Progress Notes (Signed)
   Follow-Up Visit   Subjective  Pam Russell is a 75 y.o. female who presents for the following: Follow-up (Left eye lesion returned that was previously biopsy with Dr Denna Haggard).   The following portions of the chart were reviewed this encounter and updated as appropriate:  Tobacco  Allergies  Meds  Problems  Med Hx  Surg Hx  Fam Hx      Objective  Well appearing patient in no apparent distress; mood and affect are within normal limits.  A focused examination was performed including face. Relevant physical exam findings are noted in the Assessment and Plan.  Left Forehead Brown deep nodule 5-0 nylon x 2      Assessment & Plan  Neoplasm of uncertain behavior of skin Left Forehead  Skin / nail biopsy Type of biopsy: punch   Informed consent: discussed and consent obtained   Timeout: patient name, date of birth, surgical site, and procedure verified   Procedure prep:  Patient was prepped and draped in usual sterile fashion (Non sterile) Prep type:  Chlorhexidine Anesthesia: the lesion was anesthetized in a standard fashion   Anesthetic:  1% lidocaine w/ epinephrine 1-100,000 local infiltration Punch size:  4 mm Suture size:  5-0 Suture type: nylon   Hemostasis achieved with: suture   Outcome: patient tolerated procedure well    Specimen 1 - Surgical pathology Differential Diagnosis: cyst  Check Margins: No    I, Brien Lowe, PA-C, have reviewed all documentation's for this visit.  The documentation on 09/29/21 for the exam, diagnosis, procedures and orders are all accurate and complete.

## 2021-10-02 ENCOUNTER — Ambulatory Visit (INDEPENDENT_AMBULATORY_CARE_PROVIDER_SITE_OTHER): Payer: Medicare Other

## 2021-10-02 ENCOUNTER — Other Ambulatory Visit: Payer: Self-pay

## 2021-10-02 DIAGNOSIS — Z4802 Encounter for removal of sutures: Secondary | ICD-10-CM

## 2021-10-02 NOTE — Progress Notes (Signed)
NTS Suture removal, No s/s of infection, path to pt. 

## 2021-10-08 DIAGNOSIS — H02834 Dermatochalasis of left upper eyelid: Secondary | ICD-10-CM | POA: Diagnosis not present

## 2021-10-08 DIAGNOSIS — D485 Neoplasm of uncertain behavior of skin: Secondary | ICD-10-CM | POA: Diagnosis not present

## 2021-10-08 DIAGNOSIS — H02831 Dermatochalasis of right upper eyelid: Secondary | ICD-10-CM | POA: Diagnosis not present

## 2021-10-08 DIAGNOSIS — H019 Unspecified inflammation of eyelid: Secondary | ICD-10-CM | POA: Diagnosis not present

## 2021-11-12 DIAGNOSIS — I10 Essential (primary) hypertension: Secondary | ICD-10-CM | POA: Diagnosis not present

## 2021-11-12 DIAGNOSIS — E1169 Type 2 diabetes mellitus with other specified complication: Secondary | ICD-10-CM | POA: Diagnosis not present

## 2021-11-12 DIAGNOSIS — R198 Other specified symptoms and signs involving the digestive system and abdomen: Secondary | ICD-10-CM | POA: Diagnosis not present

## 2021-11-12 DIAGNOSIS — F419 Anxiety disorder, unspecified: Secondary | ICD-10-CM | POA: Diagnosis not present

## 2021-11-12 DIAGNOSIS — Z0000 Encounter for general adult medical examination without abnormal findings: Secondary | ICD-10-CM | POA: Diagnosis not present

## 2021-11-27 ENCOUNTER — Telehealth: Payer: Self-pay | Admitting: Physician Assistant

## 2021-11-27 MED ORDER — CLOBETASOL PROPIONATE 0.05 % EX SOLN: 1.0000 "application " | Freq: Two times a day (BID) | CUTANEOUS | 6 refills | Status: AC

## 2021-11-27 MED ORDER — KETOCONAZOLE 2 % EX SHAM: 1.0000 "application " | MEDICATED_SHAMPOO | Freq: Once | CUTANEOUS | 6 refills | Status: AC

## 2021-11-27 NOTE — Telephone Encounter (Signed)
[facility] at [place]  Patient requesting refills of Ketoconazole shampoo and clobetasol solution.

## 2021-11-27 NOTE — Telephone Encounter (Signed)
Phone call to patient to have her contact her Pharmacy and have them send [place] the medication refills electronically. Patient states she will contact her Pharmacy and have them send the prescriptions.

## 2021-11-27 NOTE — Telephone Encounter (Signed)
Says [facility]-told her they no longer have a record of the prescriptions she wants refilled: Clobetasol (topical solution, liquid), Ketoconazole shampoo, 2%. Needs new Rx's on both

## 2021-11-27 NOTE — Telephone Encounter (Signed)
Called patient to let her know that refill on clobetasol solution and ketoconazole were sent to patient pharmacy.

## 2021-12-15 ENCOUNTER — Other Ambulatory Visit: Payer: Self-pay

## 2021-12-15 ENCOUNTER — Ambulatory Visit: Payer: Medicare Other | Admitting: Dermatology

## 2021-12-15 ENCOUNTER — Encounter: Payer: Self-pay | Admitting: Dermatology

## 2021-12-15 DIAGNOSIS — Z86006 Personal history of melanoma in-situ: Secondary | ICD-10-CM | POA: Diagnosis not present

## 2021-12-15 DIAGNOSIS — L821 Other seborrheic keratosis: Secondary | ICD-10-CM | POA: Diagnosis not present

## 2021-12-15 DIAGNOSIS — M542 Cervicalgia: Secondary | ICD-10-CM | POA: Diagnosis not present

## 2021-12-15 DIAGNOSIS — Z1283 Encounter for screening for malignant neoplasm of skin: Secondary | ICD-10-CM | POA: Diagnosis not present

## 2021-12-15 DIAGNOSIS — L400 Psoriasis vulgaris: Secondary | ICD-10-CM | POA: Diagnosis not present

## 2021-12-15 NOTE — Progress Notes (Signed)
° °  Follow-Up Visit   Subjective  Pam Russell is a 76 y.o. female who presents for the following: Annual Exam (Here for yearly skin exam. Patients chest is rough. Kelli froze some keratosis last visit. History of melanoma and non mole skin cancers. ).  Annual skin examination, recheck psoriasis on posterior scalp.  History of melanoma. Location:  Duration:  Quality:  Associated Signs/Symptoms: Modifying Factors:  Severity:  Timing: Context:   Objective  Well appearing patient in no apparent distress; mood and affect are within normal limits. Scalp Full body skin exam.  Beneath undergarments not fully examined.  No sign of recurrent or new melanoma.  No nonmelanoma skin cancers  Scalp Minimal scale on the posterior scalp still has ongoing itching and scratching.  Clobetasol solution does help.  Mid Back Early 10 dozen seborrheic keratoses scattered everywhere.  Although several are inflamed, none currently require any intervention.  Right Upper Back No sign recurrent pigmentation, no regional adenopathy.    A full examination was performed including scalp, head, eyes, ears, nose, lips, neck, chest, axillae, abdomen, back, buttocks, bilateral upper extremities, bilateral lower extremities, hands, feet, fingers, toes, fingernails, and toenails. All findings within normal limits unless otherwise noted below.  Areas beneath undergarments not fully examined.   Assessment & Plan    Encounter for screening for malignant neoplasm of skin Scalp  Annual skin examination.  Patient encouraged to self examine twice annually.  Continue ultraviolet protection.  Psoriasis vulgaris Scalp  Using clobetasol solution; when it is time to refill she will contact us because the clobetasol foam currently may be less expensive.  Seborrheic keratosis Mid Back  No intervention necessary.  History of melanoma in situ Right Upper Back  Check as needed change.      I, Lavonna Monarch, MD, have reviewed all documentation for this visit.  The documentation on 12/15/21 for the exam, diagnosis, procedures, and orders are all accurate and complete.

## 2021-12-29 ENCOUNTER — Other Ambulatory Visit: Payer: Self-pay | Admitting: Ophthalmology

## 2021-12-29 DIAGNOSIS — D2210 Melanocytic nevi of unspecified eyelid, including canthus: Secondary | ICD-10-CM | POA: Diagnosis not present

## 2021-12-29 DIAGNOSIS — D23122 Other benign neoplasm of skin of left lower eyelid, including canthus: Secondary | ICD-10-CM | POA: Diagnosis not present

## 2021-12-29 DIAGNOSIS — H019 Unspecified inflammation of eyelid: Secondary | ICD-10-CM | POA: Diagnosis not present

## 2021-12-29 DIAGNOSIS — D485 Neoplasm of uncertain behavior of skin: Secondary | ICD-10-CM | POA: Diagnosis not present

## 2021-12-29 DIAGNOSIS — H02831 Dermatochalasis of right upper eyelid: Secondary | ICD-10-CM | POA: Diagnosis not present

## 2021-12-29 DIAGNOSIS — H02834 Dermatochalasis of left upper eyelid: Secondary | ICD-10-CM | POA: Diagnosis not present

## 2022-01-13 DIAGNOSIS — D22122 Melanocytic nevi of left lower eyelid, including canthus: Secondary | ICD-10-CM | POA: Diagnosis not present

## 2022-01-13 DIAGNOSIS — H02831 Dermatochalasis of right upper eyelid: Secondary | ICD-10-CM | POA: Diagnosis not present

## 2022-01-13 DIAGNOSIS — H019 Unspecified inflammation of eyelid: Secondary | ICD-10-CM | POA: Diagnosis not present

## 2022-01-13 DIAGNOSIS — H02834 Dermatochalasis of left upper eyelid: Secondary | ICD-10-CM | POA: Diagnosis not present

## 2022-02-03 DIAGNOSIS — I10 Essential (primary) hypertension: Secondary | ICD-10-CM | POA: Diagnosis not present

## 2022-02-13 DIAGNOSIS — M2041 Other hammer toe(s) (acquired), right foot: Secondary | ICD-10-CM | POA: Diagnosis not present

## 2022-02-13 DIAGNOSIS — M79671 Pain in right foot: Secondary | ICD-10-CM | POA: Diagnosis not present

## 2022-03-02 DIAGNOSIS — I10 Essential (primary) hypertension: Secondary | ICD-10-CM | POA: Diagnosis not present

## 2022-03-02 DIAGNOSIS — M5030 Other cervical disc degeneration, unspecified cervical region: Secondary | ICD-10-CM | POA: Diagnosis not present

## 2022-03-04 DIAGNOSIS — H0102B Squamous blepharitis left eye, upper and lower eyelids: Secondary | ICD-10-CM | POA: Diagnosis not present

## 2022-03-04 DIAGNOSIS — H0102A Squamous blepharitis right eye, upper and lower eyelids: Secondary | ICD-10-CM | POA: Diagnosis not present

## 2022-03-04 DIAGNOSIS — H43813 Vitreous degeneration, bilateral: Secondary | ICD-10-CM | POA: Diagnosis not present

## 2022-03-04 DIAGNOSIS — Z961 Presence of intraocular lens: Secondary | ICD-10-CM | POA: Diagnosis not present

## 2022-05-13 DIAGNOSIS — E049 Nontoxic goiter, unspecified: Secondary | ICD-10-CM | POA: Diagnosis not present

## 2022-05-13 DIAGNOSIS — E1169 Type 2 diabetes mellitus with other specified complication: Secondary | ICD-10-CM | POA: Diagnosis not present

## 2022-05-13 DIAGNOSIS — F419 Anxiety disorder, unspecified: Secondary | ICD-10-CM | POA: Diagnosis not present

## 2022-05-13 DIAGNOSIS — I10 Essential (primary) hypertension: Secondary | ICD-10-CM | POA: Diagnosis not present

## 2022-05-13 DIAGNOSIS — R198 Other specified symptoms and signs involving the digestive system and abdomen: Secondary | ICD-10-CM | POA: Diagnosis not present

## 2022-05-13 DIAGNOSIS — E7800 Pure hypercholesterolemia, unspecified: Secondary | ICD-10-CM | POA: Diagnosis not present

## 2022-06-17 DIAGNOSIS — R748 Abnormal levels of other serum enzymes: Secondary | ICD-10-CM | POA: Diagnosis not present

## 2022-06-18 ENCOUNTER — Other Ambulatory Visit: Payer: Self-pay | Admitting: Family Medicine

## 2022-06-18 DIAGNOSIS — R748 Abnormal levels of other serum enzymes: Secondary | ICD-10-CM

## 2022-06-24 ENCOUNTER — Ambulatory Visit
Admission: RE | Admit: 2022-06-24 | Discharge: 2022-06-24 | Disposition: A | Discharge status: Home / Self Care | Payer: Medicare Other | Source: Ambulatory Visit | Attending: Family Medicine | Admitting: Family Medicine

## 2022-06-24 DIAGNOSIS — Z9049 Acquired absence of other specified parts of digestive tract: Secondary | ICD-10-CM | POA: Diagnosis not present

## 2022-06-24 DIAGNOSIS — R945 Abnormal results of liver function studies: Secondary | ICD-10-CM | POA: Diagnosis not present

## 2022-06-24 DIAGNOSIS — R748 Abnormal levels of other serum enzymes: Secondary | ICD-10-CM

## 2022-07-23 DIAGNOSIS — Z1231 Encounter for screening mammogram for malignant neoplasm of breast: Secondary | ICD-10-CM | POA: Diagnosis not present

## 2022-09-04 DIAGNOSIS — R748 Abnormal levels of other serum enzymes: Secondary | ICD-10-CM | POA: Diagnosis not present

## 2022-09-14 DIAGNOSIS — E7889 Other lipoprotein metabolism disorders: Secondary | ICD-10-CM | POA: Diagnosis not present

## 2022-09-14 DIAGNOSIS — Z23 Encounter for immunization: Secondary | ICD-10-CM | POA: Diagnosis not present

## 2022-09-14 DIAGNOSIS — R748 Abnormal levels of other serum enzymes: Secondary | ICD-10-CM | POA: Diagnosis not present

## 2022-11-24 DIAGNOSIS — I10 Essential (primary) hypertension: Secondary | ICD-10-CM | POA: Diagnosis not present

## 2022-11-24 DIAGNOSIS — E7800 Pure hypercholesterolemia, unspecified: Secondary | ICD-10-CM | POA: Diagnosis not present

## 2022-11-24 DIAGNOSIS — E1169 Type 2 diabetes mellitus with other specified complication: Secondary | ICD-10-CM | POA: Diagnosis not present

## 2022-11-24 DIAGNOSIS — Z0000 Encounter for general adult medical examination without abnormal findings: Secondary | ICD-10-CM | POA: Diagnosis not present

## 2022-11-24 DIAGNOSIS — E7889 Other lipoprotein metabolism disorders: Secondary | ICD-10-CM | POA: Diagnosis not present

## 2022-11-24 DIAGNOSIS — F419 Anxiety disorder, unspecified: Secondary | ICD-10-CM | POA: Diagnosis not present

## 2022-12-02 DIAGNOSIS — Z780 Asymptomatic menopausal state: Secondary | ICD-10-CM | POA: Diagnosis not present

## 2022-12-02 DIAGNOSIS — M85852 Other specified disorders of bone density and structure, left thigh: Secondary | ICD-10-CM | POA: Diagnosis not present

## 2022-12-09 DIAGNOSIS — Z1283 Encounter for screening for malignant neoplasm of skin: Secondary | ICD-10-CM | POA: Diagnosis not present

## 2022-12-09 DIAGNOSIS — D225 Melanocytic nevi of trunk: Secondary | ICD-10-CM | POA: Diagnosis not present

## 2022-12-09 DIAGNOSIS — L820 Inflamed seborrheic keratosis: Secondary | ICD-10-CM | POA: Diagnosis not present

## 2022-12-09 DIAGNOSIS — Z85820 Personal history of malignant melanoma of skin: Secondary | ICD-10-CM | POA: Diagnosis not present

## 2022-12-09 DIAGNOSIS — Z08 Encounter for follow-up examination after completed treatment for malignant neoplasm: Secondary | ICD-10-CM | POA: Diagnosis not present

## 2022-12-15 ENCOUNTER — Ambulatory Visit: Payer: Medicare Other | Admitting: Dermatology

## 2022-12-28 DIAGNOSIS — E1165 Type 2 diabetes mellitus with hyperglycemia: Secondary | ICD-10-CM | POA: Diagnosis not present

## 2022-12-28 DIAGNOSIS — I10 Essential (primary) hypertension: Secondary | ICD-10-CM | POA: Diagnosis not present

## 2022-12-28 DIAGNOSIS — R748 Abnormal levels of other serum enzymes: Secondary | ICD-10-CM | POA: Diagnosis not present

## 2022-12-28 DIAGNOSIS — E7800 Pure hypercholesterolemia, unspecified: Secondary | ICD-10-CM | POA: Diagnosis not present

## 2023-01-05 DIAGNOSIS — R748 Abnormal levels of other serum enzymes: Secondary | ICD-10-CM | POA: Diagnosis not present

## 2023-01-08 ENCOUNTER — Other Ambulatory Visit (HOSPITAL_COMMUNITY): Payer: Self-pay | Admitting: Family Medicine

## 2023-01-08 DIAGNOSIS — R748 Abnormal levels of other serum enzymes: Secondary | ICD-10-CM

## 2023-01-18 DIAGNOSIS — R6884 Jaw pain: Secondary | ICD-10-CM | POA: Diagnosis not present

## 2023-01-18 DIAGNOSIS — H9202 Otalgia, left ear: Secondary | ICD-10-CM | POA: Diagnosis not present

## 2023-01-18 DIAGNOSIS — M26622 Arthralgia of left temporomandibular joint: Secondary | ICD-10-CM | POA: Diagnosis not present

## 2023-01-21 ENCOUNTER — Ambulatory Visit (HOSPITAL_COMMUNITY)
Admission: RE | Admit: 2023-01-21 | Discharge: 2023-01-21 | Disposition: A | Discharge status: Home / Self Care | Payer: Medicare Other | Source: Ambulatory Visit | Attending: Family Medicine | Admitting: Family Medicine

## 2023-01-21 DIAGNOSIS — K760 Fatty (change of) liver, not elsewhere classified: Secondary | ICD-10-CM | POA: Diagnosis not present

## 2023-01-21 DIAGNOSIS — R748 Abnormal levels of other serum enzymes: Secondary | ICD-10-CM | POA: Insufficient documentation

## 2023-03-10 DIAGNOSIS — H0102A Squamous blepharitis right eye, upper and lower eyelids: Secondary | ICD-10-CM | POA: Diagnosis not present

## 2023-03-10 DIAGNOSIS — K080 Exfoliation of teeth due to systemic causes: Secondary | ICD-10-CM | POA: Diagnosis not present

## 2023-03-10 DIAGNOSIS — H43813 Vitreous degeneration, bilateral: Secondary | ICD-10-CM | POA: Diagnosis not present

## 2023-03-10 DIAGNOSIS — H0102B Squamous blepharitis left eye, upper and lower eyelids: Secondary | ICD-10-CM | POA: Diagnosis not present

## 2023-03-10 DIAGNOSIS — Z961 Presence of intraocular lens: Secondary | ICD-10-CM | POA: Diagnosis not present

## 2023-03-22 DIAGNOSIS — K080 Exfoliation of teeth due to systemic causes: Secondary | ICD-10-CM | POA: Diagnosis not present

## 2023-04-01 DIAGNOSIS — K080 Exfoliation of teeth due to systemic causes: Secondary | ICD-10-CM | POA: Diagnosis not present

## 2023-04-29 DIAGNOSIS — L989 Disorder of the skin and subcutaneous tissue, unspecified: Secondary | ICD-10-CM | POA: Diagnosis not present

## 2023-05-24 DIAGNOSIS — K080 Exfoliation of teeth due to systemic causes: Secondary | ICD-10-CM | POA: Diagnosis not present

## 2023-05-25 ENCOUNTER — Other Ambulatory Visit: Payer: Self-pay | Admitting: Family Medicine

## 2023-05-25 DIAGNOSIS — E1169 Type 2 diabetes mellitus with other specified complication: Secondary | ICD-10-CM | POA: Diagnosis not present

## 2023-05-25 DIAGNOSIS — E119 Type 2 diabetes mellitus without complications: Secondary | ICD-10-CM | POA: Diagnosis not present

## 2023-05-25 DIAGNOSIS — F419 Anxiety disorder, unspecified: Secondary | ICD-10-CM | POA: Diagnosis not present

## 2023-05-25 DIAGNOSIS — E7889 Other lipoprotein metabolism disorders: Secondary | ICD-10-CM | POA: Diagnosis not present

## 2023-05-25 DIAGNOSIS — E049 Nontoxic goiter, unspecified: Secondary | ICD-10-CM | POA: Diagnosis not present

## 2023-05-25 DIAGNOSIS — I10 Essential (primary) hypertension: Secondary | ICD-10-CM | POA: Diagnosis not present

## 2023-05-25 DIAGNOSIS — R350 Frequency of micturition: Secondary | ICD-10-CM | POA: Diagnosis not present

## 2023-05-25 DIAGNOSIS — E7800 Pure hypercholesterolemia, unspecified: Secondary | ICD-10-CM | POA: Diagnosis not present

## 2023-06-01 ENCOUNTER — Ambulatory Visit
Admission: RE | Admit: 2023-06-01 | Discharge: 2023-06-01 | Disposition: A | Discharge status: Home / Self Care | Payer: Medicare Other | Source: Ambulatory Visit | Attending: Family Medicine | Admitting: Family Medicine

## 2023-06-01 DIAGNOSIS — E041 Nontoxic single thyroid nodule: Secondary | ICD-10-CM | POA: Diagnosis not present

## 2023-06-01 DIAGNOSIS — E049 Nontoxic goiter, unspecified: Secondary | ICD-10-CM

## 2023-06-01 DIAGNOSIS — E042 Nontoxic multinodular goiter: Secondary | ICD-10-CM | POA: Diagnosis not present

## 2023-06-14 DIAGNOSIS — Z08 Encounter for follow-up examination after completed treatment for malignant neoplasm: Secondary | ICD-10-CM | POA: Diagnosis not present

## 2023-06-14 DIAGNOSIS — Z85820 Personal history of malignant melanoma of skin: Secondary | ICD-10-CM | POA: Diagnosis not present

## 2023-06-14 DIAGNOSIS — Z1283 Encounter for screening for malignant neoplasm of skin: Secondary | ICD-10-CM | POA: Diagnosis not present

## 2023-06-14 DIAGNOSIS — L218 Other seborrheic dermatitis: Secondary | ICD-10-CM | POA: Diagnosis not present

## 2023-06-18 ENCOUNTER — Other Ambulatory Visit: Payer: Self-pay | Admitting: Family Medicine

## 2023-06-18 DIAGNOSIS — E041 Nontoxic single thyroid nodule: Secondary | ICD-10-CM

## 2023-06-29 ENCOUNTER — Ambulatory Visit: Payer: Medicare Other | Admitting: Podiatry

## 2023-06-29 ENCOUNTER — Encounter: Payer: Self-pay | Admitting: Podiatry

## 2023-06-29 DIAGNOSIS — M7752 Other enthesopathy of left foot: Secondary | ICD-10-CM

## 2023-06-29 DIAGNOSIS — D2372 Other benign neoplasm of skin of left lower limb, including hip: Secondary | ICD-10-CM | POA: Diagnosis not present

## 2023-06-29 MED ORDER — DEXAMETHASONE SODIUM PHOSPHATE 120 MG/30ML IJ SOLN
2.0000 mg | Freq: Once | INTRAMUSCULAR | Status: AC
  Administered 2023-06-29: 2 mg via INTRA_ARTICULAR

## 2023-06-29 NOTE — Progress Notes (Signed)
Subjective:  Patient ID: Pam Russell, female    DOB: September 26, 1946,  MRN: [id number] HPI Chief Complaint  Patient presents with   Callouses    Patient is here for callous on left foot pinky toe area    77 y.o. female presents with the above complaint.   ROS: Denies fever chills nausea vomiting muscle aches pains calf pain back pain chest pain shortness of breath  Past Medical History:  Diagnosis Date   Hypertension    Malignant melanoma in situ (HCC) 05/06/2018   post right shoulder-exc   Nodular basal cell carcinoma (BCC) 06/18/2021   Left Forearm   Superficial basal cell carcinoma (BCC) 09/07/2018   [id number])   Past Surgical History:  Procedure Laterality Date   ABDOMINAL HYSTERECTOMY     BLADDER SURGERY     CHOLECYSTECTOMY     TUBAL LIGATION      Current Outpatient Medications:    ALPRAZolam (XANAX) 0.25 MG tablet, 1 tablet, Disp: , Rfl:    aspirin 81 MG tablet, Take 162 mg by mouth daily. , Disp: , Rfl:    cholecalciferol (VITAMIN D) 1000 UNITS tablet, Take 2,000 Units by mouth daily. , Disp: , Rfl:    clobetasol (TEMOVATE) 0.05 % external solution, Apply 1 application topically 2 (two) times daily., Disp: 50 mL, Rfl: 6   COVID-19 mRNA Vac-TriS, Pfizer, SUSP injection, Inject into the muscle., Disp: 0.3 mL, Rfl: 0   ketoconazole (NIZORAL) 2 % shampoo, Apply topically once., Disp: , Rfl:    lisinopril (PRINIVIL,ZESTRIL) 20 MG tablet, Take 20 mg by mouth daily., Disp: , Rfl: 1   magnesium 30 MG tablet, Take 30 mg by mouth daily. , Disp: , Rfl:    meclizine (ANTIVERT) 12.5 MG tablet, Take 1 tablet (12.5 mg total) by mouth 3 (three) times daily as needed for dizziness., Disp: 30 tablet, Rfl: 0   meloxicam (MOBIC) 15 MG tablet, Take 15 mg by mouth daily., Disp: , Rfl:    metoprolol succinate (TOPROL-XL) 50 MG 24 hr tablet, Take 50 mg by mouth daily. Take with or immediately following a meal., Disp: , Rfl:    ondansetron (ZOFRAN ODT) 4 MG disintegrating tablet, Take 1  tablet (4 mg total) by mouth every 8 (eight) hours as needed for nausea or vomiting., Disp: 20 tablet, Rfl: 0  Current Facility-Administered Medications:    aspirin chewable tablet 162 mg, 162 mg, Oral, Once, Copland, Gwenlyn Found, MD  Allergies  Allergen Reactions   Erythromycin Rash   Sulfa Antibiotics Rash   Review of Systems Objective:  There were no vitals filed for this visit.  General: Well developed, nourished, in no acute distress, alert and oriented x3   Dermatological: Skin is warm, dry and supple bilateral. Nails x 10 are well maintained; remaining integument appears unremarkable at this time. There are no open sores, no preulcerative lesions, no rash or signs of infection present.  Benign porokeratotic lesion subfifth metatarsal of the left foot with underlying bursitis.  Vascular: Dorsalis Pedis artery and Posterior Tibial artery pedal pulses are 2/4 bilateral with immedate capillary fill time. Pedal hair growth present. No varicosities and no lower extremity edema present bilateral.   Neruologic: Grossly intact via light touch bilateral. Vibratory intact via tuning fork bilateral. Protective threshold with Semmes Wienstein monofilament intact to all pedal sites bilateral. Patellar and Achilles deep tendon reflexes 2+ bilateral. No Babinski or clonus noted bilateral.   Musculoskeletal: No gross boney pedal deformities bilateral. No pain, crepitus, or limitation noted with foot  and ankle range of motion bilateral. Muscular strength 5/5 in all groups tested bilateral.  Gait: Unassisted, Nonantalgic.    Radiographs:  None taken  Assessment & Plan:   Assessment: Bursitis subfifth left benign skin lesion subfifth left.  Plan: Discussed etiology pathology conservative versus surgical therapies at this point I injected the area with dexamethasone and local anesthetic and sharply debrided the lesion today and will follow-up with her on an as-needed basis.     Yechiel Erny T. [place],  [place]

## 2023-07-29 DIAGNOSIS — Z1231 Encounter for screening mammogram for malignant neoplasm of breast: Secondary | ICD-10-CM | POA: Diagnosis not present

## 2023-08-04 DIAGNOSIS — R92321 Mammographic fibroglandular density, right breast: Secondary | ICD-10-CM | POA: Diagnosis not present

## 2023-08-04 DIAGNOSIS — R922 Inconclusive mammogram: Secondary | ICD-10-CM | POA: Diagnosis not present

## 2023-08-09 ENCOUNTER — Other Ambulatory Visit: Payer: Medicare Other

## 2023-08-25 ENCOUNTER — Ambulatory Visit
Admission: RE | Admit: 2023-08-25 | Discharge: 2023-08-25 | Disposition: A | Discharge status: Home / Self Care | Payer: Medicare Other | Source: Ambulatory Visit | Attending: Family Medicine | Admitting: Family Medicine

## 2023-08-25 ENCOUNTER — Other Ambulatory Visit (HOSPITAL_COMMUNITY)
Admission: RE | Admit: 2023-08-25 | Discharge: 2023-08-25 | Disposition: A | Discharge status: Home / Self Care | Payer: Medicare Other | Source: Ambulatory Visit | Attending: Family Medicine | Admitting: Family Medicine

## 2023-08-25 DIAGNOSIS — E041 Nontoxic single thyroid nodule: Secondary | ICD-10-CM | POA: Insufficient documentation

## 2023-08-25 DIAGNOSIS — E069 Thyroiditis, unspecified: Secondary | ICD-10-CM | POA: Diagnosis not present

## 2023-08-26 LAB — CYTOLOGY - NON PAP

## 2023-09-20 DIAGNOSIS — K080 Exfoliation of teeth due to systemic causes: Secondary | ICD-10-CM | POA: Diagnosis not present

## 2023-11-22 DIAGNOSIS — I10 Essential (primary) hypertension: Secondary | ICD-10-CM | POA: Diagnosis not present

## 2023-11-22 DIAGNOSIS — E049 Nontoxic goiter, unspecified: Secondary | ICD-10-CM | POA: Diagnosis not present

## 2023-11-22 DIAGNOSIS — E7800 Pure hypercholesterolemia, unspecified: Secondary | ICD-10-CM | POA: Diagnosis not present

## 2023-11-22 DIAGNOSIS — E1169 Type 2 diabetes mellitus with other specified complication: Secondary | ICD-10-CM | POA: Diagnosis not present

## 2023-11-26 DIAGNOSIS — E7800 Pure hypercholesterolemia, unspecified: Secondary | ICD-10-CM | POA: Diagnosis not present

## 2023-11-26 DIAGNOSIS — F419 Anxiety disorder, unspecified: Secondary | ICD-10-CM | POA: Diagnosis not present

## 2023-11-26 DIAGNOSIS — E049 Nontoxic goiter, unspecified: Secondary | ICD-10-CM | POA: Diagnosis not present

## 2023-11-26 DIAGNOSIS — Z0000 Encounter for general adult medical examination without abnormal findings: Secondary | ICD-10-CM | POA: Diagnosis not present

## 2023-11-26 DIAGNOSIS — I10 Essential (primary) hypertension: Secondary | ICD-10-CM | POA: Diagnosis not present

## 2024-01-31 DIAGNOSIS — L821 Other seborrheic keratosis: Secondary | ICD-10-CM | POA: Diagnosis not present

## 2024-01-31 DIAGNOSIS — Z08 Encounter for follow-up examination after completed treatment for malignant neoplasm: Secondary | ICD-10-CM | POA: Diagnosis not present

## 2024-01-31 DIAGNOSIS — D225 Melanocytic nevi of trunk: Secondary | ICD-10-CM | POA: Diagnosis not present

## 2024-01-31 DIAGNOSIS — Z85820 Personal history of malignant melanoma of skin: Secondary | ICD-10-CM | POA: Diagnosis not present

## 2024-01-31 DIAGNOSIS — L720 Epidermal cyst: Secondary | ICD-10-CM | POA: Diagnosis not present

## 2024-02-10 DIAGNOSIS — M25561 Pain in right knee: Secondary | ICD-10-CM | POA: Diagnosis not present

## 2024-02-25 DIAGNOSIS — E1169 Type 2 diabetes mellitus with other specified complication: Secondary | ICD-10-CM | POA: Diagnosis not present

## 2024-02-25 DIAGNOSIS — E7889 Other lipoprotein metabolism disorders: Secondary | ICD-10-CM | POA: Diagnosis not present

## 2024-02-28 DIAGNOSIS — L720 Epidermal cyst: Secondary | ICD-10-CM | POA: Diagnosis not present

## 2024-02-29 DIAGNOSIS — S83206A Unspecified tear of unspecified meniscus, current injury, right knee, initial encounter: Secondary | ICD-10-CM | POA: Diagnosis not present

## 2024-02-29 DIAGNOSIS — E7889 Other lipoprotein metabolism disorders: Secondary | ICD-10-CM | POA: Diagnosis not present

## 2024-02-29 DIAGNOSIS — E1169 Type 2 diabetes mellitus with other specified complication: Secondary | ICD-10-CM | POA: Diagnosis not present

## 2024-03-20 DIAGNOSIS — H0102A Squamous blepharitis right eye, upper and lower eyelids: Secondary | ICD-10-CM | POA: Diagnosis not present

## 2024-03-20 DIAGNOSIS — H0102B Squamous blepharitis left eye, upper and lower eyelids: Secondary | ICD-10-CM | POA: Diagnosis not present

## 2024-03-20 DIAGNOSIS — H43813 Vitreous degeneration, bilateral: Secondary | ICD-10-CM | POA: Diagnosis not present

## 2024-03-20 DIAGNOSIS — G43B0 Ophthalmoplegic migraine, not intractable: Secondary | ICD-10-CM | POA: Diagnosis not present

## 2024-03-21 DIAGNOSIS — K080 Exfoliation of teeth due to systemic causes: Secondary | ICD-10-CM | POA: Diagnosis not present

## 2024-04-11 DIAGNOSIS — E1165 Type 2 diabetes mellitus with hyperglycemia: Secondary | ICD-10-CM | POA: Diagnosis not present

## 2024-04-11 DIAGNOSIS — E1169 Type 2 diabetes mellitus with other specified complication: Secondary | ICD-10-CM | POA: Diagnosis not present

## 2024-04-11 DIAGNOSIS — I10 Essential (primary) hypertension: Secondary | ICD-10-CM | POA: Diagnosis not present

## 2024-04-15 DIAGNOSIS — I10 Essential (primary) hypertension: Secondary | ICD-10-CM | POA: Diagnosis not present

## 2024-04-15 DIAGNOSIS — E1169 Type 2 diabetes mellitus with other specified complication: Secondary | ICD-10-CM | POA: Diagnosis not present

## 2024-04-15 DIAGNOSIS — E1165 Type 2 diabetes mellitus with hyperglycemia: Secondary | ICD-10-CM | POA: Diagnosis not present

## 2024-04-15 DIAGNOSIS — E7800 Pure hypercholesterolemia, unspecified: Secondary | ICD-10-CM | POA: Diagnosis not present

## 2024-05-10 DIAGNOSIS — E1165 Type 2 diabetes mellitus with hyperglycemia: Secondary | ICD-10-CM | POA: Diagnosis not present

## 2024-05-10 DIAGNOSIS — I10 Essential (primary) hypertension: Secondary | ICD-10-CM | POA: Diagnosis not present

## 2024-05-10 DIAGNOSIS — E1169 Type 2 diabetes mellitus with other specified complication: Secondary | ICD-10-CM | POA: Diagnosis not present

## 2024-05-15 DIAGNOSIS — I10 Essential (primary) hypertension: Secondary | ICD-10-CM | POA: Diagnosis not present

## 2024-05-15 DIAGNOSIS — E7800 Pure hypercholesterolemia, unspecified: Secondary | ICD-10-CM | POA: Diagnosis not present

## 2024-05-15 DIAGNOSIS — E1169 Type 2 diabetes mellitus with other specified complication: Secondary | ICD-10-CM | POA: Diagnosis not present

## 2024-05-15 DIAGNOSIS — E1165 Type 2 diabetes mellitus with hyperglycemia: Secondary | ICD-10-CM | POA: Diagnosis not present

## 2024-06-09 DIAGNOSIS — E1165 Type 2 diabetes mellitus with hyperglycemia: Secondary | ICD-10-CM | POA: Diagnosis not present

## 2024-06-09 DIAGNOSIS — E1169 Type 2 diabetes mellitus with other specified complication: Secondary | ICD-10-CM | POA: Diagnosis not present

## 2024-06-09 DIAGNOSIS — I10 Essential (primary) hypertension: Secondary | ICD-10-CM | POA: Diagnosis not present

## 2024-06-15 DIAGNOSIS — E7800 Pure hypercholesterolemia, unspecified: Secondary | ICD-10-CM | POA: Diagnosis not present

## 2024-06-15 DIAGNOSIS — E1165 Type 2 diabetes mellitus with hyperglycemia: Secondary | ICD-10-CM | POA: Diagnosis not present

## 2024-06-15 DIAGNOSIS — E1169 Type 2 diabetes mellitus with other specified complication: Secondary | ICD-10-CM | POA: Diagnosis not present

## 2024-06-15 DIAGNOSIS — I10 Essential (primary) hypertension: Secondary | ICD-10-CM | POA: Diagnosis not present

## 2024-06-16 DIAGNOSIS — E042 Nontoxic multinodular goiter: Secondary | ICD-10-CM | POA: Diagnosis not present

## 2024-06-16 DIAGNOSIS — E041 Nontoxic single thyroid nodule: Secondary | ICD-10-CM | POA: Diagnosis not present

## 2024-06-22 DIAGNOSIS — M26622 Arthralgia of left temporomandibular joint: Secondary | ICD-10-CM | POA: Diagnosis not present

## 2024-07-13 DIAGNOSIS — E1169 Type 2 diabetes mellitus with other specified complication: Secondary | ICD-10-CM | POA: Diagnosis not present

## 2024-07-13 DIAGNOSIS — I10 Essential (primary) hypertension: Secondary | ICD-10-CM | POA: Diagnosis not present

## 2024-07-13 DIAGNOSIS — K5900 Constipation, unspecified: Secondary | ICD-10-CM | POA: Diagnosis not present

## 2024-07-13 DIAGNOSIS — F419 Anxiety disorder, unspecified: Secondary | ICD-10-CM | POA: Diagnosis not present

## 2024-07-13 DIAGNOSIS — R1032 Left lower quadrant pain: Secondary | ICD-10-CM | POA: Diagnosis not present

## 2024-07-16 DIAGNOSIS — E7800 Pure hypercholesterolemia, unspecified: Secondary | ICD-10-CM | POA: Diagnosis not present

## 2024-07-16 DIAGNOSIS — E1165 Type 2 diabetes mellitus with hyperglycemia: Secondary | ICD-10-CM | POA: Diagnosis not present

## 2024-07-16 DIAGNOSIS — E1169 Type 2 diabetes mellitus with other specified complication: Secondary | ICD-10-CM | POA: Diagnosis not present

## 2024-07-16 DIAGNOSIS — I10 Essential (primary) hypertension: Secondary | ICD-10-CM | POA: Diagnosis not present

## 2024-08-07 DIAGNOSIS — Z1231 Encounter for screening mammogram for malignant neoplasm of breast: Secondary | ICD-10-CM | POA: Diagnosis not present

## 2024-08-28 DIAGNOSIS — R399 Unspecified symptoms and signs involving the genitourinary system: Secondary | ICD-10-CM | POA: Diagnosis not present

## 2024-10-03 DIAGNOSIS — R350 Frequency of micturition: Secondary | ICD-10-CM | POA: Diagnosis not present

## 2024-10-03 DIAGNOSIS — N898 Other specified noninflammatory disorders of vagina: Secondary | ICD-10-CM | POA: Diagnosis not present

## 2024-10-03 DIAGNOSIS — N958 Other specified menopausal and perimenopausal disorders: Secondary | ICD-10-CM | POA: Diagnosis not present

## 2024-10-05 DIAGNOSIS — K080 Exfoliation of teeth due to systemic causes: Secondary | ICD-10-CM | POA: Diagnosis not present

## 2024-10-20 DIAGNOSIS — K080 Exfoliation of teeth due to systemic causes: Secondary | ICD-10-CM | POA: Diagnosis not present
